# Patient Record
Sex: Female | Born: 1980 | Race: Black or African American | Hispanic: No | Marital: Single | Smoking: Current every day smoker
Health system: Southern US, Community
[De-identification: ages and names within clinical notes are randomized; demographics above are authoritative.]

## PROBLEM LIST (undated history)

## (undated) DIAGNOSIS — S0500XA Injury of conjunctiva and corneal abrasion without foreign body, unspecified eye, initial encounter: Secondary | ICD-10-CM

---

## 2012-07-11 ENCOUNTER — Encounter (HOSPITAL_COMMUNITY): Payer: Self-pay | Admitting: Emergency Medicine

## 2012-07-11 ENCOUNTER — Emergency Department (HOSPITAL_COMMUNITY)
Admission: EM | Admit: 2012-07-11 | Discharge: 2012-07-11 | Disposition: A | Discharge status: Home / Self Care | Payer: Self-pay | Attending: Emergency Medicine | Admitting: Emergency Medicine

## 2012-07-11 ENCOUNTER — Emergency Department (HOSPITAL_COMMUNITY): Payer: Self-pay

## 2012-07-11 DIAGNOSIS — R0609 Other forms of dyspnea: Secondary | ICD-10-CM | POA: Insufficient documentation

## 2012-07-11 DIAGNOSIS — R0989 Other specified symptoms and signs involving the circulatory and respiratory systems: Secondary | ICD-10-CM | POA: Insufficient documentation

## 2012-07-11 DIAGNOSIS — Z87891 Personal history of nicotine dependence: Secondary | ICD-10-CM

## 2012-07-11 DIAGNOSIS — R059 Cough, unspecified: Secondary | ICD-10-CM | POA: Insufficient documentation

## 2012-07-11 DIAGNOSIS — S058X9A Other injuries of unspecified eye and orbit, initial encounter: Secondary | ICD-10-CM | POA: Insufficient documentation

## 2012-07-11 DIAGNOSIS — X58XXXA Exposure to other specified factors, initial encounter: Secondary | ICD-10-CM | POA: Insufficient documentation

## 2012-07-11 DIAGNOSIS — F17200 Nicotine dependence, unspecified, uncomplicated: Secondary | ICD-10-CM | POA: Insufficient documentation

## 2012-07-11 DIAGNOSIS — Y929 Unspecified place or not applicable: Secondary | ICD-10-CM | POA: Insufficient documentation

## 2012-07-11 DIAGNOSIS — S0500XA Injury of conjunctiva and corneal abrasion without foreign body, unspecified eye, initial encounter: Secondary | ICD-10-CM

## 2012-07-11 DIAGNOSIS — R05 Cough: Secondary | ICD-10-CM | POA: Insufficient documentation

## 2012-07-11 DIAGNOSIS — R0600 Dyspnea, unspecified: Secondary | ICD-10-CM

## 2012-07-11 DIAGNOSIS — Y939 Activity, unspecified: Secondary | ICD-10-CM | POA: Insufficient documentation

## 2012-07-11 MED ORDER — FLUORESCEIN SODIUM 1 MG OP STRP
1.0000 | ORAL_STRIP | Freq: Once | OPHTHALMIC | Status: DC
  Filled 2012-07-11: qty 1

## 2012-07-11 MED ORDER — GUAIFENESIN ER 600 MG PO TB12: 1200.0000 mg | ORAL_TABLET | Freq: Two times a day (BID) | ORAL | Status: DC

## 2012-07-11 MED ORDER — ALBUTEROL SULFATE HFA 108 (90 BASE) MCG/ACT IN AERS
2.0000 | INHALATION_SPRAY | Freq: Once | RESPIRATORY_TRACT | Status: AC
  Administered 2012-07-11: 2 via RESPIRATORY_TRACT
  Filled 2012-07-11: qty 6.7

## 2012-07-11 MED ORDER — TETRACAINE HCL 0.5 % OP SOLN
2.0000 [drp] | Freq: Once | OPHTHALMIC | Status: AC
  Administered 2012-07-11: 2 [drp] via OPHTHALMIC
  Filled 2012-07-11: qty 2

## 2012-07-11 MED ORDER — ERYTHROMYCIN 5 MG/GM OP OINT: TOPICAL_OINTMENT | OPHTHALMIC | Status: DC

## 2012-07-11 NOTE — ED Notes (Signed)
PA at bedside.

## 2012-07-11 NOTE — ED Notes (Signed)
Pt requests inhaler for sob at night when sleeping. Left eye red, draining, sore x3 days. A&Ox4, ambulatory, nad.

## 2012-07-11 NOTE — ED Notes (Signed)
Onset 3-4 days ago clear watery drainage with light sensitivity.  Pain currently 9/10 achy and blurry vision. Right eye no complaints.

## 2012-07-11 NOTE — ED Provider Notes (Signed)
History   This chart was scribed for non-physician practitioner working with [name], by Gerlean Ren, ED Scribe. This patient was seen in room TR08C/TR08C and the patient's care was started at 4:05 PM.    CSN: [id number]  Arrival date & time 07/11/12  1331   First MD Initiated Contact with Patient 07/11/12 1553      Chief Complaint  Patient presents with  . Eye Problem     The history is provided by the patient and medical records. No language interpreter was used.  Christine Simpson is a 32 y.o. female who presents to the Emergency Department complaining of 3 days of constant, gradually worsening, aching and burning left eye pain that is worsened by lgihts with associated redness in left eye and blurred vision in left eye.  No itching, no symptoms in right eye.  Pt has applied wet rag at night while sleeping which helps temporarily ease pain.  Pt denies discharge while sleeping and eye is not matted shut upon waking up.  Pt denies any incidents where she may have scratched the eye or splashed any fluids into it.  Pt does not wear glasses or contacts.  Pt denies rhinorrhea, sore throat, otalgia, fever.  Numbing medicine drops given by nurse immediately before entering room eased pain.  Pt also reports productive cough and some dyspnea when lying down at night.  She denies current symptoms.  Pt is a current everyday smoker but denies alcohol use.    History reviewed. No pertinent past medical history.  History reviewed. No pertinent past surgical history.  No family history on file.  History  Substance Use Topics  . Smoking status: Current Every Day Smoker  . Smokeless tobacco: Not on file  . Alcohol Use: No    No OB history provided.   Review of Systems  Constitutional: Negative for fever, diaphoresis, appetite change, fatigue and unexpected weight change.  HENT: Negative for mouth sores and neck stiffness.   Eyes: Positive for photophobia, pain, redness and visual  disturbance. Negative for discharge and itching.  Respiratory: Positive for cough and shortness of breath. Negative for chest tightness and wheezing.   Cardiovascular: Negative for chest pain.  Gastrointestinal: Negative for nausea, vomiting, abdominal pain, diarrhea and constipation.  Genitourinary: Negative for dysuria, urgency, frequency and hematuria.  Musculoskeletal: Negative for back pain.  Skin: Negative for rash.  Neurological: Negative for syncope, light-headedness and headaches.  Hematological: Does not bruise/bleed easily.  Psychiatric/Behavioral: Negative for sleep disturbance. The patient is not nervous/anxious.   All other systems reviewed and are negative.    Allergies  Review of patient's allergies indicates no known allergies.  Home Medications   Current Outpatient Rx  Name  Route  Sig  Dispense  Refill  . ERYTHROMYCIN 5 MG/GM OP OINT      Place a 1/2 inch ribbon of ointment into the lower eyelid.   1 g   0   . GUAIFENESIN ER 600 MG PO TB12   Oral   Take 2 tablets (1,200 mg total) by mouth 2 (two) times daily.   20 tablet   0     BP 145/92  Pulse 82  Temp 98.1 F (36.7 C)  Resp 18  SpO2 99%  Physical Exam  Nursing note and vitals reviewed. Constitutional: She is oriented to person, place, and time. She appears well-developed and well-nourished. No distress.  HENT:  Head: Normocephalic and atraumatic.  Nose: Nose normal.  Mouth/Throat: Uvula is midline, oropharynx is clear  and moist and mucous membranes are normal. No oropharyngeal exudate, posterior oropharyngeal edema, posterior oropharyngeal erythema or tonsillar abscesses.  Eyes: EOM and lids are normal. Pupils are equal, round, and reactive to light. No foreign bodies found. Right eye exhibits no exudate. Left eye exhibits no exudate. Right conjunctiva is not injected. Right conjunctiva has no hemorrhage. Left conjunctiva is injected. Left conjunctiva has no hemorrhage. No scleral icterus.   Fundoscopic exam:      The right eye shows no AV nicking, no hemorrhage and no papilledema.       The left eye shows no AV nicking, no hemorrhage and no papilledema.  Slit lamp exam:      The right eye shows no corneal abrasion, no corneal flare, no corneal ulcer, no foreign body, no fluorescein uptake and no anterior chamber bulge.       The left eye shows corneal abrasion and fluorescein uptake. The left eye shows no corneal flare, no corneal ulcer, no foreign body and no anterior chamber bulge.       Bilateral Distance:  20/30 ;  R Distance:  20/30 ;  L Distance:  20/30  Neck: Normal range of motion. Neck supple.  Cardiovascular: Normal rate, regular rhythm, normal heart sounds and intact distal pulses.  Exam reveals no gallop and no friction rub.   No murmur heard. Pulmonary/Chest: Effort normal and breath sounds normal. No respiratory distress. She has no decreased breath sounds. She has no wheezes. She has no rhonchi. She has no rales. She exhibits no tenderness.  Abdominal: Soft. Bowel sounds are normal. She exhibits no mass. There is no tenderness. There is no rebound and no guarding.  Musculoskeletal: Normal range of motion. She exhibits no edema.  Lymphadenopathy:    She has no cervical adenopathy.  Neurological: She is alert and oriented to person, place, and time. She exhibits normal muscle tone. Coordination normal.       Speech is clear and goal oriented Moves extremities without ataxia  Skin: Skin is warm and dry. She is not diaphoretic. No erythema.  Psychiatric: She has a normal mood and affect.    ED Course  Procedures (including critical care time) DIAGNOSTIC STUDIES: Oxygen Saturation is 99% on room air, normal by my interpretation.    COORDINATION OF CARE: 4:11 PM- Patient informed of clinical course including chest XR for cough, understands medical decision-making process, and agrees with plan.  Dg Chest 2 View  07/11/2012  *RADIOLOGY REPORT*  Clinical Data:  Cough and shortness of breath.  CHEST - 2 VIEW  Comparison: None.  Findings: The trachea is midline.  Heart size normal.  Lungs are clear.  No pleural fluid.  IMPRESSION: No acute findings.   Original Report Authenticated By: Leanna Battles, M.D.    1. Corneal abrasion   2. Smoking history   3. Cough   4. Dyspnea       MDM  Christine Simpson presents with eye irritation and PE consistent with corneal abrasion.  Pt with corneal abrasion on PE.  Eye irrigated w NS, no evidence of FB.  No change in vision, acuity equal bilaterally.  Pt is not a contact lens wearer.  Exam non-concerning for orbital cellulitis, hyphema, corneal ulcers. Patient will be discharged home with erythromycin.   Patient understands to follow up with ophthalmology, & to return to ER if new symptoms develop including change in vision, purulent drainage, or entrapment.  Pt also with URI symptoms of cough and feeling SOB at night.  Pt  CXR negative for acute infiltrate. Patients symptoms are consistent with URI, likely viral etiology vs smoking.  Discussed smoking cessation. Discussed that antibiotics are not indicated for viral infections. Pt will be discharged with symptomatic treatment.  Verbalizes understanding and is agreeable with plan. Pt is hemodynamically stable & in NAD prior to dc.   1. Medications: albuterol, mucinex, erythromycin, usual home medications 2. Treatment: rest, drink plenty of fluids, take medications as prescribed 3. Follow Up: Please followup with your primary doctor for discussion of your diagnoses and further evaluation after today's visit; if you do not have a primary care doctor use the resource guide provided to find one;  I personally performed the services described in this documentation, which was scribed in my presence. The recorded information has been reviewed and is accurate.      Christine Client Aubreigh Fuerte, PA-C 07/11/12 1821

## 2012-07-12 ENCOUNTER — Telehealth (HOSPITAL_COMMUNITY): Payer: Self-pay | Admitting: Emergency Medicine

## 2012-07-12 NOTE — ED Notes (Addendum)
Pharmacy called for direction clarification for Erythromycin Opthalmic ointment instructions read "Place a 1/2 inch ribbon of ointment into the lower eyelid" Dr Hyacinth Meeker consulted and verbal order given for instructions "Place a 1/2 inch ribbon of ointment into the lower eyelid every 4 hours x 7 days"  Clarification given to Pharmacist.

## 2012-07-12 NOTE — ED Provider Notes (Signed)
Medical screening examination/treatment/procedure(s) were performed by non-physician practitioner and as supervising physician I was immediately available for consultation/collaboration.   Christopher J. Pollina, MD 07/12/12 1059 

## 2012-08-02 ENCOUNTER — Emergency Department (HOSPITAL_BASED_OUTPATIENT_CLINIC_OR_DEPARTMENT_OTHER)
Admission: EM | Admit: 2012-08-02 | Discharge: 2012-08-02 | Disposition: A | Discharge status: Home / Self Care | Payer: Self-pay | Attending: Emergency Medicine | Admitting: Emergency Medicine

## 2012-08-02 ENCOUNTER — Encounter (HOSPITAL_BASED_OUTPATIENT_CLINIC_OR_DEPARTMENT_OTHER): Payer: Self-pay

## 2012-08-02 DIAGNOSIS — Z87891 Personal history of nicotine dependence: Secondary | ICD-10-CM | POA: Insufficient documentation

## 2012-08-02 DIAGNOSIS — Z3202 Encounter for pregnancy test, result negative: Secondary | ICD-10-CM | POA: Insufficient documentation

## 2012-08-02 DIAGNOSIS — Z87828 Personal history of other (healed) physical injury and trauma: Secondary | ICD-10-CM | POA: Insufficient documentation

## 2012-08-02 DIAGNOSIS — Z79899 Other long term (current) drug therapy: Secondary | ICD-10-CM | POA: Insufficient documentation

## 2012-08-02 DIAGNOSIS — R350 Frequency of micturition: Secondary | ICD-10-CM | POA: Insufficient documentation

## 2012-08-02 DIAGNOSIS — N760 Acute vaginitis: Secondary | ICD-10-CM | POA: Insufficient documentation

## 2012-08-02 DIAGNOSIS — N390 Urinary tract infection, site not specified: Secondary | ICD-10-CM | POA: Insufficient documentation

## 2012-08-02 DIAGNOSIS — B9689 Other specified bacterial agents as the cause of diseases classified elsewhere: Secondary | ICD-10-CM

## 2012-08-02 HISTORY — DX: Injury of conjunctiva and corneal abrasion without foreign body, unspecified eye, initial encounter: S05.00XA

## 2012-08-02 LAB — URINALYSIS, ROUTINE W REFLEX MICROSCOPIC
Nitrite: NEGATIVE
Protein, ur: NEGATIVE mg/dL
Specific Gravity, Urine: 1.023 (ref 1.005–1.030)
Urobilinogen, UA: 1 mg/dL (ref 0.0–1.0)

## 2012-08-02 LAB — PREGNANCY, URINE: Preg Test, Ur: NEGATIVE

## 2012-08-02 LAB — URINE MICROSCOPIC-ADD ON

## 2012-08-02 LAB — WET PREP, GENITAL

## 2012-08-02 MED ORDER — SULFAMETHOXAZOLE-TRIMETHOPRIM 800-160 MG PO TABS: 1.0000 | ORAL_TABLET | Freq: Two times a day (BID) | ORAL | Status: AC

## 2012-08-02 MED ORDER — METRONIDAZOLE 500 MG PO TABS: 500.0000 mg | ORAL_TABLET | Freq: Two times a day (BID) | ORAL | Status: DC

## 2012-08-02 NOTE — ED Provider Notes (Signed)
Medical screening examination/treatment/procedure(s) were performed by non-physician practitioner and as supervising physician I was immediately available for consultation/collaboration.   Gwyneth Sprout, MD 08/02/12 [phone]

## 2012-08-02 NOTE — ED Notes (Addendum)
Pt reports urinary frequency x 4 days and abnormal vaginal bleeding x 4-5 months. Pt does not has a GYN. She also reports continued pain in left eye. She was seen at [facility] 07/11/12 and diagnosed with a corneal abrasion.

## 2012-08-02 NOTE — ED Provider Notes (Signed)
History     CSN: [id number]  Arrival date & time 08/02/12  1139   First MD Initiated Contact with Patient 08/02/12 1213      Chief Complaint  Patient presents with  . Urinary Frequency  . Vaginal Bleeding    (Consider location/radiation/quality/duration/timing/severity/associated sxs/prior treatment) HPI Comments: Pt states that she has had irregular vaginal bleeding over the last couple of months where her period is not predictable:pt states that she has also had urinary frequency for the last couple of days:no fever,vomiting, abdominal pain  Patient is a 32 y.o. female presenting with frequency and vaginal bleeding. The history is provided by the patient. No language interpreter was used.  Urinary Frequency This is a new problem. The current episode started in the past 7 days. The problem occurs constantly. The problem has been unchanged. Pertinent negatives include no abdominal pain or fever.  Vaginal Bleeding Pertinent negatives include no abdominal pain or fever.    Past Medical History  Diagnosis Date  . Corneal abrasion     History reviewed. No pertinent past surgical history.  No family history on file.  History  Substance Use Topics  . Smoking status: Former Games developer  . Smokeless tobacco: Not on file  . Alcohol Use: No    OB History   Grav Para Term Preterm Abortions TAB SAB Ect Mult Living                  Review of Systems  Constitutional: Negative for fever.  Gastrointestinal: Negative for abdominal pain.  Genitourinary: Positive for frequency and vaginal bleeding.    Allergies  Review of patient's allergies indicates no known allergies.  Home Medications   Current Outpatient Rx  Name  Route  Sig  Dispense  Refill  . erythromycin ophthalmic ointment      Place a 1/2 inch ribbon of ointment into the lower eyelid.   1 g   0   . guaiFENesin (MUCINEX) 600 MG 12 hr tablet   Oral   Take 2 tablets (1,200 mg total) by mouth 2 (two) times daily.  20 tablet   0     BP 144/93  Pulse 86  Temp(Src) 98.1 F (36.7 C) (Oral)  Resp 20  Ht 5\' 6"  (1.676 m)  Wt 315 lb (142.883 kg)  BMI 50.87 kg/m2  SpO2 97%  LMP 07/27/2012  Physical Exam  Nursing note and vitals reviewed. Constitutional: She is oriented to person, place, and time. She appears well-developed and well-nourished.  HENT:  Head: Normocephalic and atraumatic.  Cardiovascular: Normal rate and regular rhythm.   Pulmonary/Chest: Effort normal and breath sounds normal.  Abdominal: Soft. Bowel sounds are normal.  Genitourinary:  Mild red discharge noted in vaginal vault:-cmt  Musculoskeletal: Normal range of motion.  Neurological: She is alert and oriented to person, place, and time.  Skin: Skin is warm and dry.  Psychiatric: She has a normal mood and affect.    ED Course  Procedures (including critical care time)  Labs Reviewed  WET PREP, GENITAL - Abnormal; Notable for the following:    Clue Cells Wet Prep HPF POC FEW (*)    WBC, Wet Prep HPF POC MANY (*)    All other components within normal limits  URINALYSIS, ROUTINE W REFLEX MICROSCOPIC - Abnormal; Notable for the following:    Hgb urine dipstick LARGE (*)    Leukocytes, UA SMALL (*)    All other components within normal limits  URINE MICROSCOPIC-ADD ON - Abnormal; Notable for the  following:    Bacteria, UA FEW (*)    All other components within normal limits  GC/CHLAMYDIA PROBE AMP  URINE CULTURE  PREGNANCY, URINE   No results found.   1. UTI (lower urinary tract infection)   2. BV (bacterial vaginosis)       MDM  Pt okay to follow up with health department:abdomen not acute       Teressa Lower, NP 08/02/12 1344

## 2012-08-03 LAB — URINE CULTURE: Culture: NO GROWTH

## 2012-08-03 LAB — GC/CHLAMYDIA PROBE AMP
CT Probe RNA: NEGATIVE
GC Probe RNA: NEGATIVE

## 2014-08-25 ENCOUNTER — Emergency Department (HOSPITAL_BASED_OUTPATIENT_CLINIC_OR_DEPARTMENT_OTHER)
Admission: EM | Admit: 2014-08-25 | Discharge: 2014-08-25 | Disposition: A | Discharge status: Home / Self Care | Payer: Self-pay | Attending: Emergency Medicine | Admitting: Emergency Medicine

## 2014-08-25 ENCOUNTER — Encounter (HOSPITAL_BASED_OUTPATIENT_CLINIC_OR_DEPARTMENT_OTHER): Payer: Self-pay

## 2014-08-25 DIAGNOSIS — K088 Other specified disorders of teeth and supporting structures: Secondary | ICD-10-CM | POA: Insufficient documentation

## 2014-08-25 DIAGNOSIS — K0889 Other specified disorders of teeth and supporting structures: Secondary | ICD-10-CM

## 2014-08-25 DIAGNOSIS — K0381 Cracked tooth: Secondary | ICD-10-CM | POA: Insufficient documentation

## 2014-08-25 DIAGNOSIS — Z87891 Personal history of nicotine dependence: Secondary | ICD-10-CM | POA: Insufficient documentation

## 2014-08-25 MED ORDER — IBUPROFEN 400 MG PO TABS
600.0000 mg | ORAL_TABLET | Freq: Once | ORAL | Status: AC
  Administered 2014-08-25: 600 mg via ORAL
  Filled 2014-08-25 (×2): qty 1

## 2014-08-25 NOTE — ED Notes (Signed)
Left lower toothache x 1 week.

## 2014-08-25 NOTE — Discharge Instructions (Signed)
Dental Pain °A tooth ache may be caused by cavities (tooth decay). Cavities expose the nerve of the tooth to air and hot or cold temperatures. It may come from an infection or abscess (also called a boil or furuncle) around your tooth. It is also often caused by dental caries (tooth decay). This causes the pain you are having. °DIAGNOSIS  °Your caregiver can diagnose this problem by exam. °TREATMENT  °· If caused by an infection, it may be treated with medications which kill germs (antibiotics) and pain medications as prescribed by your caregiver. Take medications as directed. °· Only take over-the-counter or prescription medicines for pain, discomfort, or fever as directed by your caregiver. °· Whether the tooth ache today is caused by infection or dental disease, you should see your dentist as soon as possible for further care. °SEEK MEDICAL CARE IF: °The exam and treatment you received today has been provided on an emergency basis only. This is not a substitute for complete medical or dental care. If your problem worsens or new problems (symptoms) appear, and you are unable to meet with your dentist, call or return to this location. °SEEK IMMEDIATE MEDICAL CARE IF:  °· You have a fever. °· You develop redness and swelling of your face, jaw, or neck. °· You are unable to open your mouth. °· You have severe pain uncontrolled by pain medicine. °MAKE SURE YOU:  °· Understand these instructions. °· Will watch your condition. °· Will get help right away if you are not doing well or get worse. °Document Released: 05/23/2005 Document Revised: 08/15/2011 Document Reviewed: 01/09/2008 °ExitCare® Patient Information ©2015 [employer]. This information is not intended to replace advice given to you by your health care provider. Make sure you discuss any questions you have with your health care provider. ° °Dental Care and Dentist Visits °Dental care supports good overall health. Regular dental visits can also help you  avoid dental pain, bleeding, infection, and other more serious health problems in the future. It is important to keep the mouth healthy because diseases in the teeth, gums, and other oral tissues can spread to other areas of the body. Some problems, such as diabetes, heart disease, and pre-term labor have been associated with poor oral health.  °See your dentist every 6 months. If you experience emergency problems such as a toothache or broken tooth, go to the dentist right away. If you see your dentist regularly, you may catch problems early. It is easier to be treated for problems in the early stages.  °WHAT TO EXPECT AT A DENTIST VISIT  °Your dentist will look for many common oral health problems and recommend proper treatment. At your regular dental visit, you can expect: °· Gentle cleaning of the teeth and gums. This includes scraping and polishing. This helps to remove the sticky substance around the teeth and gums (plaque). Plaque forms in the mouth shortly after eating. Over time, plaque hardens on the teeth as tartar. If tartar is not removed regularly, it can cause problems. Cleaning also helps remove stains. °· Periodic X-rays. These pictures of the teeth and supporting bone will help your dentist assess the health of your teeth. °· Periodic fluoride treatments. Fluoride is a natural mineral shown to help strengthen teeth. Fluoride treatment involves applying a fluoride gel or varnish to the teeth. It is most commonly done in children. °· Examination of the mouth, tongue, jaws, teeth, and gums to look for any oral health problems, such as: °¨ Cavities (dental caries). This is   decay on the tooth caused by plaque, sugar, and acid in the mouth. It is best to catch a cavity when it is small.  Inflammation of the gums caused by plaque buildup (gingivitis).  Problems with the mouth or malformed or misaligned teeth.  Oral cancer or other diseases of the soft tissues or jaws. KEEP YOUR TEETH AND GUMS  HEALTHY For healthy teeth and gums, follow these general guidelines as well as your dentist's specific advice:  Have your teeth professionally cleaned at the dentist every 6 months.  Brush twice daily with a fluoride toothpaste.  Floss your teeth daily.  Ask your dentist if you need fluoride supplements, treatments, or fluoride toothpaste.  Eat a healthy diet. Reduce foods and drinks with added sugar.  Avoid smoking. TREATMENT FOR ORAL HEALTH PROBLEMS If you have oral health problems, treatment varies depending on the conditions present in your teeth and gums.  Your caregiver will most likely recommend good oral hygiene at each visit.  For cavities, gingivitis, or other oral health disease, your caregiver will perform a procedure to treat the problem. This is typically done at a separate appointment. Sometimes your caregiver will refer you to another dental specialist for specific tooth problems or for surgery. SEEK IMMEDIATE DENTAL CARE IF:  You have pain, bleeding, or soreness in the gum, tooth, jaw, or mouth area.  A permanent tooth becomes loose or separated from the gum socket.  You experience a blow or injury to the mouth or jaw area. Document Released: 02/02/2011 Document Revised: 08/15/2011 Document Reviewed: 02/02/2011 [facility] Patient Information 2015 [place], [place]. This information is not intended to replace advice given to you by your health care provider. Make sure you discuss any questions you have with your health care provider.   Emergency Department Resource Guide 1) Find a Doctor and Pay Out of Pocket Although you won't have to find out who is covered by your insurance plan, it is a good idea to ask around and get recommendations. You will then need to call the office and see if the doctor you have chosen will accept you as a new patient and what types of options they offer for patients who are self-pay. Some doctors offer discounts or will set up payment plans  for their patients who do not have insurance, but you will need to ask so you aren't surprised when you get to your appointment.  2) Contact Your Local Health Department Not all health departments have doctors that can see patients for sick visits, but many do, so it is worth a call to see if yours does. If you don't know where your local health department is, you can check in your phone book. The CDC also has a tool to help you locate your state's health department, and many state websites also have listings of all of their local health departments.  3) Find a [facility] If your illness is not likely to be very severe or complicated, you may want to try a walk in clinic. These are popping up all over the country in pharmacies, drugstores, and shopping centers. They're usually staffed by nurse practitioners or physician assistants that have been trained to treat common illnesses and complaints. They're usually fairly quick and inexpensive. However, if you have serious medical issues or chronic medical problems, these are probably not your best option.  No Primary Care Doctor: - Call Health Connect at  [phone] - they can help you locate a primary care doctor that  accepts your  insurance, provides certain services, etc. - Physician Referral Service- [phone]  Chronic Pain Problems: Organization         Address  Phone   Notes  [facility]  [phone] Patients need to be referred by their primary care doctor.   Medication Assistance: Organization         Address  Phone   Notes  [facility] Medication [facility] [address] [place], [place] [address] [phone] --Must be a resident of [facility] -- Must have NO insurance coverage whatsoever (no Medicaid/ Medicare, etc.) -- The pt. MUST have a primary care doctor that directs their care regularly and follows them in the community   [facility]  [phone]   [employer]  [phone]    Agencies that provide inexpensive medical care: Organization         Address  Phone   Notes  [place]  [phone]   [facility]    [phone]   [facility] [place], [place] [address] [phone]   [place] [address], [place] [phone]   Planned Parenthood    [phone]   [facility]    [phone]   [place] [address], [place] Phone:  [phone], Fax:  [phone] Hours of Operation:  9 am - 6 pm, M-F.  Also accepts Medicaid/Medicare and self-pay.  [facility] for [place], [address], [place] Phone: [phone], Fax: [phone]. Hours of Operation:  8:30 am - 5:30 pm, M-F.  Also accepts Medicaid and self-pay.  [facility] [place] [address], [place] Phone: [phone]   [place], [place], [place] [phone], Ext. [phone] Mondays & Thursdays: 7-9 AM.  First 15 patients are seen on a first come, first serve basis.    [place] Providers:  Organization         Address  Phone   Notes  [facility] [address], [place] [phone] Also accepts self-pay patients.  [facility] [address], [place]  [phone]   [place], [address], [place] [phone]   [facility] [address], [place] [phone]   Lucianne Lei [address], [place]   [phone] Only accepts [place] Access [place] patients after they have their name applied to their card.   Self-Pay (no insurance) in [facility]:  Organization         Address  Phone   Notes  Sickle Cell Patients, [facility] [place] [phone]   [facility] [place] [phone]   [facility] [place]  [place], [place], [place] [phone]   [facility]/Dr. Osei-Bonsu  [address], [place] or [address], [place] [phone] Phone number for both [place] and [place] locations is the same.  [facility] [address] [facility] [phone]   [facility] [address] [place] or [address] [phone]   [facility] [phone]  [place] [phone], phone; [phone], fax Sees patients 1st and 3rd Saturday of every month.  Must not qualify for public or private insurance (i.e. Medicaid, Medicare, [place] Health Choice, Veterans' Benefits)  Household income should be no more than 200% of the poverty level The clinic cannot treat you if you are pregnant or think you are pregnant  Sexually transmitted diseases are not treated at the clinic.    Dental Care: Organization         Address  Phone  Notes  [employer] [facility] Department of [facility] [place] [phone] Accepts children up to age 20 who are enrolled in [place] or [place]; pregnant women with a Medicaid card; and children who have applied for Medicaid or [place] Health Choice, but were declined, whose parents can pay a reduced fee at time of service.  [facility] Department of [facility]  [address], [place] [phone] Accepts children up to age 35 who are enrolled in [place] or [place]; pregnant women with a Medicaid card; and children who have applied for Medicaid or  Health Choice, but were declined, whose parents can pay a reduced fee at time of service.  [place] Adult [facility] Access PRO[facility]  [place] [phone] Patients are  seen by appointment only. Walk-ins are not accepted. [place] will see patients 68 years of age and older. Monday - Tuesday (8am-5pm) Most Wednesdays (8:30-5pm) $30 per visit, cash only  [facility] Adult Dental Access PROGRAM  [address], [facility] [phone] Patients are seen by appointment only. Walk-ins are not accepted. [place] will see patients 73 years of age and older. One Wednesday Evening (Monthly: Volunteer Based).  $30 per visit, cash only  [place]  [phone] for adults; Children under age 63, call Graduate Pediatric Dentistry at [phone]. Children aged 85-14, please call [phone] to request a pediatric application.  Dental services are provided in all areas of dental care including fillings, crowns and bridges, complete and partial dentures, implants, gum treatment, root canals, and extractions. Preventive care is also provided. Treatment is provided to both adults and children. Patients are selected via a lottery and there is often a waiting list.   [facility] [address], [place]  [phone] [email or web address]   [facility] [address] [place], [place] [phone], Ext. [phone] Second and Fourth Thursday of each month, opens at 6:30 AM; Clinic ends at 9 AM.  Patients are seen on a first-come first-served basis, and a limited number are seen during each clinic.   [facility]  [address] [place], [place] [phone]   Eligibility Requirements You must have lived in [place], [place], or [place] counties for at least the last three months.   You cannot be eligible for state or federal sponsored [employer], including [employer], [place], or [employer].   You generally cannot be eligible for healthcare insurance through your employer.    How to apply: Eligibility screenings are held every Tuesday and Wednesday afternoon from 1:00 pm until 4:00  pm. You do not need an appointment for the interview!  [facility] [address], [place]roz, [place]   [place]  [place]  [phone]

## 2014-08-25 NOTE — ED Provider Notes (Signed)
CSN: [id number]     Arrival date & time 08/25/14  1417 History  This chart was scribed for Christine MoMatthew Aaron Boeh, MD by Abel PrestoKara Demonbreun, ED Scribe. This patient was seen in room MH04/MH04 and the patient's care was started at 3:14 PM.    Chief Complaint  Patient presents with  . Dental Pain     Patient is a 34 y.o. female presenting with tooth pain. The history is provided by the patient. No language interpreter was used.  Dental Pain Location:  Lower Quality:  Throbbing Severity:  Moderate Onset quality:  Sudden Duration:  2 days Timing:  Constant Progression:  Worsening Chronicity:  New Context: filling fell out   Previous work-up:  Filled cavity Relieved by:  None tried Worsened by:  Nothing tried Ineffective treatments:  None tried Associated symptoms: facial pain   Associated symptoms: no congestion, no facial swelling, no fever and no gum swelling   Risk factors: lack of dental care    HPI Comments: Christine Simpson is a 34 y.o. female who presents to the Emergency Department complaining of constant throbbing lower right dental pain with onset 2 days ago, worsening today. Pt states she thinks her filling has fallen out. Pt denies fever, cough, congestion, and sore throat. Pt does not have a dentist.   Past Medical History  Diagnosis Date  . Corneal abrasion    History reviewed. No pertinent past surgical history. No family history on file. History  Substance Use Topics  . Smoking status: Former Games developermoker  . Smokeless tobacco: Not on file  . Alcohol Use: No   OB History    No data available     Review of Systems  Constitutional: Negative for fever.  HENT: Positive for dental problem. Negative for congestion, facial swelling and sore throat.   Respiratory: Negative for cough.   All other systems reviewed and are negative.     Allergies  Review of patient's allergies indicates no known allergies.  Home Medications   Prior to Admission medications   Medication Sig  Start Date End Date Taking? Authorizing Provider  UNKNOWN TO PATIENT abx for vaginal yeast infection   Yes Historical Provider, MD   BP 178/102 mmHg  Pulse 78  Temp(Src) 98.5 F (36.9 C) (Oral)  Resp 18  Ht 5\' 6"  (1.676 m)  Wt 324 lb (146.965 kg)  BMI 52.32 kg/m2  SpO2 97%  LMP 08/17/2014 Physical Exam  Constitutional: She is oriented to person, place, and time. She appears well-developed and well-nourished.  HENT:  Head: Normocephalic and atraumatic.  Right Ear: External ear normal.  Left Ear: External ear normal.  No abscesses, dentition with dental fracture to L 1st molar, chronic appearing  Eyes: Conjunctivae and EOM are normal. Pupils are equal, round, and reactive to light.  Neck: Normal range of motion. Neck supple.  Cardiovascular: Normal rate, regular rhythm, normal heart sounds and intact distal pulses.   Pulmonary/Chest: Effort normal and breath sounds normal.  Abdominal: Soft. Bowel sounds are normal. There is no tenderness.  Musculoskeletal: Normal range of motion.  Neurological: She is alert and oriented to person, place, and time.  Skin: Skin is warm and dry.  Psychiatric: She has a normal mood and affect. Her behavior is normal.  Nursing note and vitals reviewed.   ED Course  Procedures (including critical care time) DIAGNOSTIC STUDIES: Oxygen Saturation is 97% on room air, normal by my interpretation.    COORDINATION OF CARE: 3:19 PM Discussed treatment plan with patient at beside, the  patient agrees with the plan and has no further questions at this time.   Labs Review Labs Reviewed - No data to display  Imaging Review No results found.   EKG Interpretation None      MDM   Final diagnoses:  Pain, dental      34 y.o. female without pertinent PMH presents with recurrent dental pain from a lost filling. Symptoms present 2 days, no trismus, systemic infectious symptoms. Exam reassuring. Patient offered dental block and refused. Unable to give  narcotics due to the fact the patient drove. Discharged home with dentistry follow-up..    I have reviewed all laboratory and imaging studies if ordered as above  1. Pain, dental          Christine Mo, MD 08/25/14 [phone]

## 2015-06-22 ENCOUNTER — Emergency Department (HOSPITAL_BASED_OUTPATIENT_CLINIC_OR_DEPARTMENT_OTHER)
Admission: EM | Admit: 2015-06-22 | Discharge: 2015-06-22 | Disposition: A | Discharge status: Home / Self Care | Payer: Self-pay | Attending: Emergency Medicine | Admitting: Emergency Medicine

## 2015-06-22 ENCOUNTER — Encounter (HOSPITAL_BASED_OUTPATIENT_CLINIC_OR_DEPARTMENT_OTHER): Payer: Self-pay | Admitting: *Deleted

## 2015-06-22 DIAGNOSIS — H53143 Visual discomfort, bilateral: Secondary | ICD-10-CM | POA: Insufficient documentation

## 2015-06-22 DIAGNOSIS — H6123 Impacted cerumen, bilateral: Secondary | ICD-10-CM | POA: Insufficient documentation

## 2015-06-22 DIAGNOSIS — H5713 Ocular pain, bilateral: Secondary | ICD-10-CM | POA: Insufficient documentation

## 2015-06-22 DIAGNOSIS — Z87828 Personal history of other (healed) physical injury and trauma: Secondary | ICD-10-CM | POA: Insufficient documentation

## 2015-06-22 DIAGNOSIS — Z87891 Personal history of nicotine dependence: Secondary | ICD-10-CM | POA: Insufficient documentation

## 2015-06-22 DIAGNOSIS — H9201 Otalgia, right ear: Secondary | ICD-10-CM

## 2015-06-22 DIAGNOSIS — H538 Other visual disturbances: Secondary | ICD-10-CM | POA: Insufficient documentation

## 2015-06-22 MED ORDER — NAPHAZOLINE-PHENIRAMINE 0.025-0.3 % OP SOLN
1.0000 [drp] | Freq: Four times a day (QID) | OPHTHALMIC | Status: DC | PRN
Start: 2015-06-22 — End: 2016-08-10

## 2015-06-22 MED ORDER — FLUORESCEIN SODIUM 1 MG OP STRP
1.0000 | ORAL_STRIP | Freq: Once | OPHTHALMIC | Status: AC
  Administered 2015-06-22: 1 via OPHTHALMIC
  Filled 2015-06-22: qty 1

## 2015-06-22 MED ORDER — TETRACAINE HCL 0.5 % OP SOLN
1.0000 [drp] | Freq: Once | OPHTHALMIC | Status: AC
  Administered 2015-06-22: 1 [drp] via OPHTHALMIC
  Filled 2015-06-22: qty 4

## 2015-06-22 MED FILL — VISINE-A EYE ALLERGY DROPS: 0.025-0.3 | 30 days supply | Qty: 15 | Fill #0

## 2015-06-22 NOTE — ED Notes (Signed)
Right ear pain. States she has been light sensitive for a week.

## 2015-06-22 NOTE — ED Provider Notes (Signed)
CSN: [id number]     Arrival date & time 06/22/15  1231 History   First MD Initiated Contact with Patient 06/22/15 1322     Chief Complaint  Patient presents with  . Eye Pain  . Otalgia     (Consider location/radiation/quality/duration/timing/severity/associated sxs/prior Treatment) HPI   Patient is a 35 year old female with no past medical history presents the ED with complaint of bilateral eye pain. She notes she has had bilateral blurred vision that occurred 2 weeks ago with associated redness, watering of her eyes, light sensitivity and mild itching. She notes the symptoms resolved last week after using over-the-counter normal saline eyedrops but states that her symptoms returned approximately 3 days ago and has since been constant. Denies any recent trauma/injury to her eyes. Denies contact use. Patient also reports having right ear pain and notes her hearing in her right ear is muffled. She notes she used peroxide at home to try to remove the earwax without relief. Denies drainage. Denies fever, headache, lightheadedness, dizziness, is a congestion, rhinorrhea, sore throat.   Past Medical History  Diagnosis Date  . Corneal abrasion    History reviewed. No pertinent past surgical history. No family history on file. Social History  Substance Use Topics  . Smoking status: Former Games developer  . Smokeless tobacco: None  . Alcohol Use: No   OB History    No data available     Review of Systems  HENT: Positive for ear pain.   Eyes: Positive for photophobia, discharge (watery), redness, itching and visual disturbance (blurred vision).  All other systems reviewed and are negative.     Allergies  Review of patient's allergies indicates no known allergies.  Home Medications   Prior to Admission medications   Medication Sig Start Date End Date Taking? Authorizing Provider  naphazoline-pheniramine (NAPHCON-A) 0.025-0.3 % ophthalmic solution Place 1 drop into both eyes every 6 (six)  hours as needed for irritation. 06/22/15   Barrett Henle, PA-C  UNKNOWN TO PATIENT abx for vaginal yeast infection    Historical Provider, MD   BP 113/68 mmHg  Pulse 70  Temp(Src) 98.7 F (37.1 C) (Oral)  Resp 18  Ht 5\' 6"  (1.676 m)  Wt 146.965 kg  BMI 52.32 kg/m2  SpO2 98%  LMP 06/15/2015 Physical Exam  Constitutional: She is oriented to person, place, and time. She appears well-developed and well-nourished.  HENT:  Head: Normocephalic and atraumatic.  Nose: Nose normal. Right sinus exhibits no maxillary sinus tenderness and no frontal sinus tenderness. Left sinus exhibits no maxillary sinus tenderness and no frontal sinus tenderness.  Mouth/Throat: Uvula is midline, oropharynx is clear and moist and mucous membranes are normal.  Bilateral cerumen impaction, unable to visualize TMs.  Eyes: EOM and lids are normal. Pupils are equal, round, and reactive to light. Lids are everted and swept, no foreign bodies found. Right eye exhibits no chemosis, no discharge and no exudate. No foreign body present in the right eye. Left eye exhibits no chemosis, no discharge and no exudate. No foreign body present in the left eye. Right conjunctiva is injected. Left conjunctiva is injected. No scleral icterus.  Slit lamp exam:      The right eye shows no corneal abrasion, no corneal flare, no corneal ulcer, no foreign body and no fluorescein uptake.       The left eye shows no corneal abrasion, no corneal flare, no corneal ulcer, no foreign body and no fluorescein uptake.  Neck: Normal range of motion. Neck supple.  Cardiovascular: Normal rate, regular rhythm, normal heart sounds and intact distal pulses.   Pulmonary/Chest: Effort normal and breath sounds normal. No respiratory distress. She has no wheezes. She has no rales. She exhibits no tenderness.  Abdominal: Soft. Bowel sounds are normal. She exhibits no distension and no mass. There is no tenderness. There is no rebound and no guarding.   Musculoskeletal: She exhibits no edema.  Lymphadenopathy:    She has no cervical adenopathy.  Neurological: She is alert and oriented to person, place, and time.  Skin: Skin is warm and dry.  Nursing note and vitals reviewed.   ED Course  Procedures (including critical care time) Labs Review Labs Reviewed - No data to display  Imaging Review No results found. I have personally reviewed and evaluated these images and lab results as part of my medical decision-making.  Filed Vitals:   06/22/15 1506 06/22/15 1539  BP: 132/90 113/68  Pulse: 66 70  Temp:  98.7 F (37.1 C)  Resp: 18      MDM   Final diagnoses:  Eye pain, bilateral  Ear pain, right    Patient presents with bilateral eye pain and right ear pain. Denies contact use. VSS. On initial exam bilateral cerumen impaction present, unable to visualize TMs. Eye exam is revealed bilateral injection of conjunctiva, with watery drainage noted. Visual acuity unremarkable. No fluorescein uptake to bilateral eyes on exam. I do not suspect corneal abrasion/ulcer at this time. Nurse irrigated bilateral ears for ear wax removal. On reexamination, TMs clear. I suspect pt's sxs are likely due to allergic conjunctivitis. Plan to d/c pt home with naphazoline eye drops. Advised pt to follow up with PCP.  Evaluation does not show pathology requring ongoing emergent intervention or admission. Pt is hemodynamically stable and mentating appropriately. Discussed findings/results and plan with patient/guardian, who agrees with plan. All questions answered. Return precautions discussed and outpatient follow up given.      Satira Sarkicole Elizabeth EastonNadeau, [place] JerseyPA-C 06/22/15 1716  Gwyneth SproutWhitney Plunkett, MD 06/26/15 2050

## 2015-06-22 NOTE — Discharge Instructions (Signed)
User eyedrops as prescribed. I also recommend continuing to use over the counter artifical tears. Please follow up with a primary care provider from the Resource Guide provided below in 4-5 days. Please return to the Emergency Department if symptoms worsen or new onset of visual changes, discharge, headache, change in hearing, ear drainage, eye discharge.   Emergency Department Resource Guide 1) Find a Doctor and Pay Out of Pocket Although you won't have to find out who is covered by your insurance plan, it is a good idea to ask around and get recommendations. You will then need to call the office and see if the doctor you have chosen will accept you as a new patient and what types of options they offer for patients who are self-pay. Some doctors offer discounts or will set up payment plans for their patients who do not have insurance, but you will need to ask so you aren't surprised when you get to your appointment.  2) Contact Your Local Health Department Not all health departments have doctors that can see patients for sick visits, but many do, so it is worth a call to see if yours does. If you don't know where your local health department is, you can check in your phone book. The CDC also has a tool to help you locate your state's health department, and many state websites also have listings of all of their local health departments.  3) Find a Walk-in Clinic If your illness is not likely to be very severe or complicated, you may want to try a walk in clinic. These are popping up all over the country in pharmacies, drugstores, and shopping centers. They're usually staffed by nurse practitioners or physician assistants that have been trained to treat common illnesses and complaints. They're usually fairly quick and inexpensive. However, if you have serious medical issues or chronic medical problems, these are probably not your best option.  No Primary Care Doctor: - Call Health Connect at  [phone]  - they can help you locate a primary care doctor that  accepts your insurance, provides certain services, etc. - Physician Referral Service- [phone]  Chronic Pain Problems: Organization         Address  Phone   Notes  [facility]  [phone]) [phone] Patients need to be referred by their primary care doctor.   Medication Assistance: Organization         Address  Phone   Notes  [facility] [address] [place], [place] [address] [phone]) [phone] --Must be a resident of [facility] -- Must have NO insurance coverage whatsoever (no Medicaid/ Medicare, etc.) -- The pt. MUST have a primary care doctor that directs their care regularly and follows them in the community   MedAssist  [phone]   [employer]  [phone]) [phone]    Agencies that provide inexpensive medical care: Organization         Address  Phone   Notes  [facility]  [phone](336) [phone]   [facility]    [phone]) [phone]   [facility] [address] [place], [place] [address] [phone]) [phone]   [facility] [address], [place] [phone]   Planned Parenthood    [phone]   [facility]    [phone]   [facility]  [address], [employer] [place] Phone:  [phone](336) [phone], Fax:  [phone])  [phone] Hours of Operation:  9 am - 6 pm, M-F.  Also accepts Medicaid/Medicare and self-pay.  [facility] for [place], [address], [place] Phone: [phone], Fax: [phone]. Hours of Operation:  8:30 am - 5:30 pm, M-F.  Also accepts Medicaid and self-pay.  [facility] [address], [place] Phone: [phone]   [place], [place], [place] [phone], Ext. [phone] Mondays & Thursdays: 7-9 AM.  First 15 patients are seen on a first come, first serve basis.     [place] Providers:  Organization         Address  Phone   Notes  [facility] [address], [place] [phone] Also accepts self-pay patients.  [facility] [address] [place], [place]  [phone]   [place], [address], [place] [phone]   [facility] [address] [place] [phone]   Lucianne Lei [address], [place]   [phone] Only accepts [place] Access [place] patients after they have their name applied to their card.   Self-Pay (no insurance) in [facility]:  Organization         Address  Phone   Notes  Sickle Cell Patients, [facility] [place] [phone]   [facility]gent [facility] [place] [phone]   [facility] [place]  [place], [place], [place] [phone]   [facility]/Dr. Osei-Bonsu  [address], [place] or [address], [place] [phone] Phone number for both [place] and [place] locations is the same.  Urgent Medical and [facility] [address] [place] [phone]   [facility] [address], [place] or [address] [phone]   [facility] [address] [place] [phone], phone; [phone], fax Sees patients 1st and 3rd Saturday of every month.  Must not qualify for public or private insurance (i.e. Medicaid, Medicare, [place] Health Choice, Veterans' Benefits)  Household income should be no more than 200% of the poverty level The clinic cannot treat you if you are pregnant or think you are pregnant  Sexually transmitted diseases are not treated at the clinic.    Dental Care: Organization         Address  Phone  Notes  [facility]  Department of [facility] [place] [phone] Accepts children up to age 56 who are enrolled in [place] or [place]; pregnant women with a Medicaid card; and children who have applied for Medicaid or [place] Health Choice, but were declined, whose parents can pay a reduced fee at time of service.  [facility] Department of [facility]  [address], [place] [phone] Accepts children up to age 63 who are enrolled in [place] or [place]; pregnant women with a Medicaid card; and children who have applied for Medicaid or [place] Health Choice, but were declined, whose parents can pay a reduced fee at time of service.  [facility] Access PROGRAM  [place] [phone] Patients are seen by appointment only. Walk-ins are not accepted. [place]  will see patients 67 years of age and older. Monday - Tuesday (8am-5pm) Most Wednesdays (8:30-5pm) $30 per visit, cash only  [facility] Adult Dental Access PROGRAM  [address], [facility] [phone] Patients are seen by appointment only. Walk-ins are not accepted. [place] will see patients 32 years of age and older. One Wednesday Evening (Monthly: Volunteer Based).  $30 per visit, cash only  [place]  [phone] for adults; Children under age 60, call Graduate Pediatric Dentistry at [phone]. Children aged 74-14, please call [phone] to request a pediatric application.  Dental services are provided in all areas of dental care including fillings, crowns and bridges, complete and partial dentures, implants, gum treatment, root canals, and extractions. Preventive care is also provided. Treatment is provided to both adults and children. Patients are selected via a lottery and there is often a waiting list.   [facility] [address], [place]  [phone]  [email or web address]   [facility] [address] [place], [place] [phone], Ext. [phone] Second and Fourth Thursday of each month, opens at 6:30 AM; Clinic ends at 9 AM.  Patients are seen on a first-come first-served basis, and a limited number are seen during each clinic.   [facility]  [address] Hillard Danker [place], [place] [phone]   Eligibility Requirements You must have lived in [place], [place], or [place] counties for at least the last three months.   You cannot be eligible for state or federal sponsored [employer], including [employer], [place], or [employer].   You generally cannot be eligible for healthcare insurance through your employer.    How to apply: Eligibility screenings are held every Tuesday and Wednesday afternoon from 1:00 pm until 4:00 pm. You do not need an appointment for the interview!  [facility] [address] [place], [place]   [place]  [place] Department  [place]  [phone]    Behavioral Health Resources in the Community: Intensive Outpatient Programs Organization         Address  Phone  Notes  [place]. [address], [place], [place] [phone]   [facility] Outpatient [address] [place], [place]   ADS: Alcohol & Drug Svcs [address], [place], [place]   [place] [address],  [place], [place] or [phone]   Substance Abuse Resources Organization         Address  Phone  Notes  Alcohol and Drug Services  [phone]   [place]  [phone]   [place]   Chinita Pester  [phone]   Residential & Outpatient Substance Abuse Program  [phone]   Psychological Services Organization          Address  Phone  Notes  [facility] [place]  Hall  [phone]   [place] [address], [place] or [phone]    Mobile Crisis Teams Organization         Address  Phone  Notes  [facility]rapeutic Alternatives, Mobile Crisis Care Unit  [phone]   Assertive Psychotherapeutic Services  [address] [place], [place]   [facility] [address] [place] [phone]    Self-Help/Support Groups Organization  Address  Phone             Notes  [place]. of [place] - variety of support groups  [place] Call for more information  Narcotics Anonymous (NA), Caring Services [address], [employer] [place]  2 meetings at this location   Special educational needs teacher         Address  Phone  Notes  ASAP Residential Treatment [place],    [place]  [phone]   [facility]  [address] [place] [phone], [place], [place]   [place], [place] [phone] Admissions: 8am-3pm M-F  Incentives Substance [place] [address]    [place], [place] [phone]   [facility] [address] [place], [place], [place]   [facility] [address]  [place], [place]   [facility] - Intensive Outpatient [address], [place], [place]   [facility] ([place].,  [place], [place] [phone] or [phone]   [facility]idential Treatment Services (RTS) [address] [place], [place] Accepts Medicaid  [facility] [place] [address],  [place] [phone] Substance Abuse/Addiction Treatment   [facility] Organization         Address  Phone  Notes  [facility]  [phone]   Domenic Schwab, PhD [address] Arlis [place], [place]   [phone] or [phone]   [place], [place] [phone]   [facility] [address] [place], [place] [phone] Insurance/Medicaid/sponsorship through [facility] and Families [address], [address] [place]                                    [place], [place] [phone] [place] [address], [place] [phone]    Dr. Adele Schilder  [phone]   [facility] Dept. 1) [address], [place] 2) [place] 3)  [place] [address], [place] [phone]  [phone]   [place] [phone] or [phone] (After Hours)

## 2015-06-22 NOTE — ED Notes (Signed)
Pt directed to pharmacy for medications

## 2015-08-14 ENCOUNTER — Encounter (HOSPITAL_BASED_OUTPATIENT_CLINIC_OR_DEPARTMENT_OTHER): Payer: Self-pay | Admitting: *Deleted

## 2015-08-14 ENCOUNTER — Emergency Department (HOSPITAL_BASED_OUTPATIENT_CLINIC_OR_DEPARTMENT_OTHER)
Admission: EM | Admit: 2015-08-14 | Discharge: 2015-08-14 | Disposition: A | Discharge status: Home / Self Care | Payer: Self-pay | Attending: Emergency Medicine | Admitting: Emergency Medicine

## 2015-08-14 DIAGNOSIS — Z87891 Personal history of nicotine dependence: Secondary | ICD-10-CM | POA: Insufficient documentation

## 2015-08-14 DIAGNOSIS — Z8781 Personal history of (healed) traumatic fracture: Secondary | ICD-10-CM | POA: Insufficient documentation

## 2015-08-14 DIAGNOSIS — J029 Acute pharyngitis, unspecified: Secondary | ICD-10-CM | POA: Insufficient documentation

## 2015-08-14 LAB — RAPID STREP SCREEN (MED CTR MEBANE ONLY): Streptococcus, Group A Screen (Direct): NEGATIVE

## 2015-08-14 MED ORDER — LIDOCAINE VISCOUS 2 % MT SOLN: 20.0000 mL | OROMUCOSAL | Status: DC | PRN

## 2015-08-14 NOTE — ED Notes (Signed)
States her throat hurts when she swallows. She was treated for thrush and finished the medication yesterday.

## 2015-08-14 NOTE — ED Provider Notes (Signed)
CSN: [id number]     Arrival date & time 08/14/15  1154 History   First MD Initiated Contact with Patient 08/14/15 1233     Chief Complaint  Patient presents with  . Sore Throat    (Consider location/radiation/quality/duration/timing/severity/associated sxs/prior Treatment) Patient is a 35 y.o. female presenting with pharyngitis. The history is provided by the patient. No language interpreter was used.  Sore Throat Associated symptoms include a sore throat.  Miss Meredeth IdeFleming is a 35 year old female with no significant past medical history who presents for gradual onset sore throat x 3 days. Worse with swallowing. Reports something white stuck on the back of her tongue. She was recently treated for thrush and finished medication yesterday. Denies any other symptoms including fever, chills, cough, nasal congestion, rhinorrhea, shortness of breath, nausea, or vomiting.  Past Medical History  Diagnosis Date  . Corneal abrasion    History reviewed. No pertinent past surgical history. No family history on file. Social History  Substance Use Topics  . Smoking status: Former Games developermoker  . Smokeless tobacco: None  . Alcohol Use: No   OB History    No data available     Review of Systems  HENT: Positive for sore throat.       Allergies  Review of patient's allergies indicates no known allergies.  Home Medications   Prior to Admission medications   Medication Sig Start Date End Date Taking? Authorizing Provider  lidocaine (XYLOCAINE) 2 % solution Use as directed 20 mLs in the mouth or throat as needed for mouth pain. 08/14/15   Jessee Mezera Patel-Mills, PA-C  naphazoline-pheniramine (NAPHCON-A) 0.025-0.3 % ophthalmic solution Place 1 drop into both eyes every 6 (six) hours as needed for irritation. 06/22/15   Barrett HenleNicole Elizabeth Nadeau, PA-C  UNKNOWN TO PATIENT abx for vaginal yeast infection    Historical Provider, MD   BP 124/95 mmHg  Pulse 75  Temp(Src) 98.9 F (37.2 C) (Oral)  Resp 18  Ht 5'  6" (1.676 m)  Wt 146.965 kg  BMI 52.32 kg/m2  SpO2 98% Physical Exam  Constitutional: She is oriented to person, place, and time. She appears well-developed and well-nourished.  Eyes: Conjunctivae are normal.  Neck: Normal range of motion. Neck supple.  No anterior cervical lymphadenopathy. No drooling or trismus. Uvula midline. No tonsillar edema or exudates. Oropharynx is clear and moist.  No thrush.  Cardiovascular: Normal rate.   Pulmonary/Chest: Effort normal. No respiratory distress.  Neurological: She is alert and oriented to person, place, and time.  Skin: Skin is warm and dry.  Nursing note and vitals reviewed.   ED Course  Procedures (including critical care time) Labs Review Labs Reviewed  RAPID STREP SCREEN (NOT AT [facility])  CULTURE, GROUP A STREP [facility])    Imaging Review No results found. I have personally reviewed and evaluated these lab results as part of my medical decision-making.   EKG Interpretation None      MDM   Final diagnoses:  Pharyngitis   Patient presents for sore throat 3 days. Well-appearing and in no acute distress. Vitals stable. No other symptoms. Strep is negative. This is most likely viral. She is tolerating by mouth fluids in the ED without difficulty. I discussed return precautions as well as [facility]. Patient agrees with plan. Filed Vitals:   08/14/15 1159  BP: 124/95  Pulse: 75  Temp: 98.9 F (37.2 C)  Resp: [address]      Keora Eccleston Patel-Mills, PA-C 08/14/15 1308  Vanetta MuldersScott Zackowski, MD 08/17/15 1235

## 2015-08-14 NOTE — ED Notes (Signed)
Pt directed to pharmacy to pick up medications 

## 2015-08-14 NOTE — Discharge Instructions (Signed)
Pharyngitis Follow[facility]up with [facility]. Used lidocaine as needed for sore throat. Return for inability to tolerate fluids. Pharyngitis is redness, pain, and swelling (inflammation) of your pharynx.  CAUSES  Pharyngitis is usually caused by infection. Most of the time, these infections are from viruses (viral) and are part of a cold. However, sometimes pharyngitis is caused by bacteria (bacterial). Pharyngitis can also be caused by allergies. Viral pharyngitis may be spread from person to person by coughing, sneezing, and personal items or utensils (cups, forks, spoons, toothbrushes). Bacterial pharyngitis may be spread from person to person by more intimate contact, such as kissing.  SIGNS AND SYMPTOMS  Symptoms of pharyngitis include:   Sore throat.   Tiredness (fatigue).   Low-grade fever.   Headache.  Joint pain and muscle aches.  Skin rashes.  Swollen lymph nodes.  Plaque-like film on throat or tonsils (often seen with bacterial pharyngitis). DIAGNOSIS  Your health care provider will ask you questions about your illness and your symptoms. Your medical history, along with a physical exam, is often all that is needed to diagnose pharyngitis. Sometimes, a rapid strep test is done. Other lab tests may also be done, depending on the suspected cause.  TREATMENT  Viral pharyngitis will usually get better in 3-4 days without the use of medicine. Bacterial pharyngitis is treated with medicines that kill germs (antibiotics).  HOME CARE INSTRUCTIONS   Drink enough water and fluids to keep your urine clear or pale yellow.   Only take over-the-counter or prescription medicines as directed by your health care provider:   If you are prescribed antibiotics, make sure you finish them even if you start to feel better.   Do not take aspirin.   Get lots of rest.   Gargle with 8 oz of salt water ( tsp of salt per 1 qt of water) as often as every 1-2 hours to soothe your throat.    Throat lozenges (if you are not at risk for choking) or sprays may be used to soothe your throat. SEEK MEDICAL CARE IF:   You have large, tender lumps in your neck.  You have a rash.  You cough up green, yellow-brown, or bloody spit. SEEK IMMEDIATE MEDICAL CARE IF:   Your neck becomes stiff.  You drool or are unable to swallow liquids.  You vomit or are unable to keep medicines or liquids down.  You have severe pain that does not go away with the use of recommended medicines.  You have trouble breathing (not caused by a stuffy nose). MAKE SURE YOU:   Understand these instructions.  Will watch your condition.  Will get help right away if you are not doing well or get worse.   This information is not intended to replace advice given to you by your health care provider. Make sure you discuss any questions you have with your health care provider.   Document Released: 05/23/2005 Document Revised: 03/13/2013 Document Reviewed: 01/28/2013 Elsevier Interactive Patient Education [employer]2016 Elsevier Inc.

## 2015-08-15 LAB — CULTURE, GROUP A STREP (THRC)

## 2015-08-17 ENCOUNTER — Telehealth (HOSPITAL_COMMUNITY): Payer: Self-pay

## 2015-08-17 NOTE — Telephone Encounter (Signed)
Post ED Visit - Positive Culture Follow-up  Culture report reviewed by antimicrobial stewardship pharmacist:  []  Christine Simpson, Pharm.D. []  Christine Simpson, Pharm.D., BCPS [x]  Christine Simpson, Pharm.D. []  Christine Simpson, Pharm.D., BCPS []  Christine Simpson, [address] BoulevardPharm.D., BCPS, AAHIVP []  Christine Simpson, Pharm.D., BCPS, AAHIVP []  Christine Simpson, Pharm.D. []  Christine Simpson, [address] BoulevardPharm.D.  Positive throat culture, Abundant streptococcus, beta hemolytic not group A OK  Christine Simpson, Christine Simpson 08/17/2015, 5:42 AM

## 2016-08-10 ENCOUNTER — Encounter (HOSPITAL_BASED_OUTPATIENT_CLINIC_OR_DEPARTMENT_OTHER): Payer: Self-pay

## 2016-08-10 ENCOUNTER — Emergency Department (HOSPITAL_BASED_OUTPATIENT_CLINIC_OR_DEPARTMENT_OTHER)
Admission: EM | Admit: 2016-08-10 | Discharge: 2016-08-10 | Disposition: A | Discharge status: Home / Self Care | Payer: Self-pay | Attending: Physician Assistant | Admitting: Physician Assistant

## 2016-08-10 DIAGNOSIS — Z87891 Personal history of nicotine dependence: Secondary | ICD-10-CM | POA: Insufficient documentation

## 2016-08-10 DIAGNOSIS — H1010 Acute atopic conjunctivitis, unspecified eye: Secondary | ICD-10-CM | POA: Insufficient documentation

## 2016-08-10 MED ORDER — ERYTHROMYCIN 5 MG/GM OP OINT
TOPICAL_OINTMENT | Freq: Once | OPHTHALMIC | Status: AC
  Administered 2016-08-10: 1 via OPHTHALMIC
  Filled 2016-08-10: qty 3.5

## 2016-08-10 MED ORDER — NAPHAZOLINE-PHENIRAMINE 0.025-0.3 % OP SOLN
1.0000 [drp] | Freq: Four times a day (QID) | OPHTHALMIC | Status: DC | PRN
  Filled 2016-08-10: qty 5

## 2016-08-10 MED ORDER — NAPHAZOLINE-PHENIRAMINE 0.025-0.3 % OP SOLN
1.0000 [drp] | OPHTHALMIC | 0 refills | Status: AC | PRN
Start: 2016-08-10 — End: ?

## 2016-08-10 NOTE — ED Provider Notes (Signed)
[facility] Provider Note   CSN: [id number] Arrival date & time: 08/10/16  1601     History   Chief Complaint Chief Complaint  Patient presents with  . Eye Drainage    HPI Christine Simpson is a 36 y.o. female.  HPI   Patient is a 36 year old female presenting with redness and eye itching for one week. Patient reports that she has it yearly . She reports that we usually give her erythromycin ointment as well as Visine and these both things both help her. Patient unable to get an ophthalmologist right now she is not have insurance.   No headaches. No blurred vision. No fevers. No eye drainage.    Past Medical History:  Diagnosis Date  . Corneal abrasion     There are no active problems to display for this patient.   History reviewed. No pertinent surgical history.  OB History    No data available       Home Medications    Prior to Admission medications   Not on File    Family History No family history on file.  Social History Social History  Substance Use Topics  . Smoking status: Former Games developer  . Smokeless tobacco: Never Used  . Alcohol use Yes     Comment: occ     Allergies   Patient has no known allergies.   Review of Systems Review of Systems  Constitutional: Negative for activity change.  HENT: Negative for congestion, drooling, ear discharge, ear pain and sore throat.   Respiratory: Negative for shortness of breath.   Cardiovascular: Negative for chest pain.  Gastrointestinal: Negative for abdominal pain.     Physical Exam Updated Vital Signs BP 140/88 (BP Location: Left Arm)   Pulse 85   Temp 98.4 F (36.9 C) (Oral)   Resp 18   Ht 5\' 5"  (1.651 m)   Wt (!) 322 lb (146.1 kg)   SpO2 98%   BMI 53.58 kg/m   Physical Exam  Constitutional: She is oriented to person, place, and time. She appears well-developed and well-nourished.  HENT:  Head: Normocephalic and atraumatic.  Eyes: Right eye exhibits no discharge. Left  eye exhibits no discharge.  Trace redness. No discharge. EOMI and PERRL.   Cardiovascular:  No murmur heard. Pulmonary/Chest: Effort normal.  Neurological: She is oriented to person, place, and time.  Skin: Skin is warm and dry. She is not diaphoretic.  Psychiatric: She has a normal mood and affect.  Nursing note and vitals reviewed.    ED Treatments / Results  Labs (all labs ordered are listed, but only abnormal results are displayed) Labs Reviewed - No data to display  EKG  EKG Interpretation None       Radiology No results found.  Procedures Procedures (including critical care time)  Medications Ordered in ED Medications - No data to display   Initial Impression / Assessment and Plan / ED Course  I have reviewed the triage vital signs and the nursing notes.  Pertinent labs & imaging results that were available during my care of the patient were reviewed by me and considered in my medical decision making (see chart for details).     PT here with bed eye and discomfort. Patient has a Visual merchandiser. Think this likely allergic. We will refill her Visine and erythromycin. I don't think this is bacterial given there is no discharge.. Not suspect preseptal orbital or orbital cellulitis. Patient could have atypically uveitis. Maryclare Labrador have her follow-up with  ophthalmology. Patient does not have contacts.  Final Clinical Impressions(s) / ED Diagnoses   Final diagnoses:  None    New Prescriptions New Prescriptions   No medications on file     Isack Lavalley Randall AnLyn Ardie Dragoo, MD 08/10/16 1653

## 2016-08-10 NOTE — Discharge Instructions (Signed)
We think this is likely allergic in nature. Please see the Visine drops that we prescribed 4 times a day until you feel improved. If he gets worse or you have crusting, use the erythromycin ointment. We will also give me the name of an ophthalmologist free to follow up as needed.

## 2016-08-10 NOTE — ED Triage Notes (Addendum)
C/o eye d/c, redness, itching x 1 week-reports hx of same yearly-NAD-steady gait

## 2017-10-10 ENCOUNTER — Emergency Department (HOSPITAL_BASED_OUTPATIENT_CLINIC_OR_DEPARTMENT_OTHER)
Admission: EM | Admit: 2017-10-10 | Discharge: 2017-10-10 | Disposition: A | Discharge status: Home / Self Care | Payer: Self-pay | Attending: Emergency Medicine | Admitting: Emergency Medicine

## 2017-10-10 ENCOUNTER — Other Ambulatory Visit: Payer: Self-pay

## 2017-10-10 ENCOUNTER — Encounter (HOSPITAL_BASED_OUTPATIENT_CLINIC_OR_DEPARTMENT_OTHER): Payer: Self-pay | Admitting: *Deleted

## 2017-10-10 DIAGNOSIS — B9789 Other viral agents as the cause of diseases classified elsewhere: Secondary | ICD-10-CM | POA: Insufficient documentation

## 2017-10-10 DIAGNOSIS — J029 Acute pharyngitis, unspecified: Secondary | ICD-10-CM | POA: Insufficient documentation

## 2017-10-10 DIAGNOSIS — Z87891 Personal history of nicotine dependence: Secondary | ICD-10-CM | POA: Insufficient documentation

## 2017-10-10 LAB — RAPID STREP SCREEN (MED CTR MEBANE ONLY): Streptococcus, Group A Screen (Direct): NEGATIVE

## 2017-10-10 MED ORDER — IBUPROFEN 400 MG PO TABS
600.0000 mg | ORAL_TABLET | Freq: Once | ORAL | Status: AC
  Administered 2017-10-10: 600 mg via ORAL
  Filled 2017-10-10: qty 1

## 2017-10-10 MED ORDER — IBUPROFEN 600 MG PO TABS: 600.0000 mg | ORAL_TABLET | Freq: Four times a day (QID) | ORAL | 0 refills | Status: AC | PRN

## 2017-10-10 MED ORDER — PHENOL 1.4 % MT LIQD: 1.0000 | OROMUCOSAL | 0 refills | Status: AC | PRN

## 2017-10-10 NOTE — Discharge Instructions (Addendum)
As discussed, your rapid strep was negative today and the most common cause of sore throat and adults tends to be viral and does not require antibiotics.  If your culture returns positive for strep we will contact you and start antibiotics.  Drink tea with honey, use cough drops, throat sprays over-the-counter as needed to help soothe your throat.  Alternate between ibuprofen and Tylenol as needed for pain and fever.  Make sure that you drink plenty of fluids and stay well-hydrated keeping your urine clear. Follow up with your primary care provider or the wellness center to establish care with a primary.  Return if symptoms worsen, facial swelling, difficulty swallowing, difficulty breathing, drooling, or other new concerning symptoms in the meantime.

## 2017-10-10 NOTE — ED Provider Notes (Signed)
[facility] EMERGENCY DEPARTMENT Provider Note   CSN: [id number] Arrival date & time: 10/10/17  1642     History   Chief Complaint Chief Complaint  Patient presents with  . Sore Throat  . Chills    HPI Christine Simpson is a 37 y.o. female with no past medical history presenting with 2 days of subjective fevers, chills, sore throat.  She denies cough, congestion or other symptoms.  She has not tried anything for her symptoms at home.  No drooling, difficulty breathing or swallowing, facial swelling or chest pain.  She denies any known ill contacts or contacts with young children.  HPI  Past Medical History:  Diagnosis Date  . Corneal abrasion     There are no active problems to display for this patient.   History reviewed. No pertinent surgical history.   OB History   None      Home Medications    Prior to Admission medications   Medication Sig Start Date End Date Taking? Authorizing Provider  ibuprofen (ADVIL,MOTRIN) 600 MG tablet Take 1 tablet (600 mg total) by mouth every 6 (six) hours as needed. 10/10/17   Georgiana Shore, PA-C  naphazoline-pheniramine (NAPHCON-A) 0.025-0.3 % ophthalmic solution Place 1 drop into both eyes every 4 (four) hours as needed for irritation. 08/10/16   Mackuen, Courteney Lyn, MD  phenol (CHLORASEPTIC) 1.4 % LIQD Use as directed 1 spray in the mouth or throat as needed for throat irritation / pain. Make sure that you do not swallow and spit up after gargling. 10/10/17   Georgiana Shore, PA-C    Family History No family history on file.  Social History Social History   Tobacco Use  . Smoking status: Former Games developer  . Smokeless tobacco: Never Used  Substance Use Topics  . Alcohol use: Yes    Comment: occ  . Drug use: No     Allergies   Patient has no known allergies.   Review of Systems Review of Systems  Constitutional: Positive for chills and fever. Negative for activity change, appetite change, diaphoresis and  fatigue.       Subjective fevers and chills  HENT: Positive for sore throat. Negative for congestion, facial swelling, tinnitus and trouble swallowing.   Respiratory: Negative for cough, choking, chest tightness, shortness of breath, wheezing and stridor.   Cardiovascular: Negative for chest pain and palpitations.  Gastrointestinal: Negative for nausea and vomiting.  Musculoskeletal: Negative for myalgias, neck pain and neck stiffness.  Skin: Negative for color change, pallor and rash.  Neurological: Negative for dizziness, light-headedness and headaches.     Physical Exam Updated Vital Signs BP (!) 138/96 (BP Location: Right Arm)   Pulse 98   Temp 99 F (37.2 C) (Oral)   Resp 18   Ht  (1.676 m)   Wt (!) 143.8 kg (317 lb)   SpO2 99%   BMI 51.17 kg/m   Physical Exam  Constitutional: She appears well-developed and well-nourished.  Non-toxic appearance. She does not appear ill. No distress.  Patient is afebrile without the use of antipyretics, well-appearing nontoxic sitting comfortably in chair no acute distress.  HENT:  Head: Normocephalic.  Mouth/Throat: Uvula is midline and mucous membranes are normal. Mucous membranes are not pale, not dry and not cyanotic. No oral lesions. No uvula swelling. Posterior oropharyngeal erythema present. No oropharyngeal exudate or posterior oropharyngeal edema. Tonsils are 1+ on the right. Tonsils are 1+ on the left. Tonsillar exudate.  No gross oral abscess. Uvula  is midline, arches are simmetrical and intact. No peritonsilar swelling. No trismus. Sublingual mucosa is soft and non-tender. Tolerating oral secretions. No concern for ludwig's angina.   Eyes: EOM are normal. Right eye exhibits no discharge. Left eye exhibits no discharge.  Neck: Normal range of motion. Neck supple.  Cardiovascular: Normal rate, regular rhythm and normal heart sounds.  Pulmonary/Chest: Effort normal and breath sounds normal. No stridor. No respiratory distress.  She has no wheezes. She has no rhonchi. She has no rales.  Abdominal: She exhibits no distension.  Lymphadenopathy:    She has cervical adenopathy.  Neurological: She is alert.  Skin: Skin is warm and dry. No rash noted. She is not diaphoretic. No erythema. No pallor.  Psychiatric: She has a normal mood and affect. Her behavior is normal.  Nursing note and vitals reviewed.    ED Treatments / Results  Labs (all labs ordered are listed, but only abnormal results are displayed) Labs Reviewed  RAPID STREP SCREEN ([facility] ONLY)  CULTURE, GROUP A STREP [facility])    EKG None  Radiology No results found.  Procedures Procedures (including critical care time)  Medications Ordered in ED Medications  ibuprofen (ADVIL,MOTRIN) tablet 600 mg (600 mg Oral Given 10/10/17 1957)     Initial Impression / Assessment and Plan / ED Course  I have reviewed the triage vital signs and the nursing notes.  Pertinent labs & imaging results that were available during my care of the patient were reviewed by me and considered in my medical decision making (see chart for details).    Pt presents afebrile with mild left tonsillar exudate, negative strep. mild cervical lymphadenopathy, & odynophagia; diagnosis of viral pharyngitis. No abx indicated. DC w symptomatic tx for pain. Patient has not tried anything for her symptoms.  Options for symptomatic relief were discussed.  Pt does not appear dehydrated, but did discuss importance of water rehydration. Presentation non concerning for PTA or infxn spread to soft tissue. No trismus or uvula deviation. Specific return precautions discussed. Pt able to drink water in ED without difficulty with intact airway.   Discharge home with symptomatic relief and close follow-up with PCP. Discussed strict return precautions and advised to return to the emergency department if experiencing any new or worsening symptoms. Instructions were understood and patient agreed with  discharge plan.  Final Clinical Impressions(s) / ED Diagnoses   Final diagnoses:  Viral pharyngitis    ED Discharge Orders        Ordered    ibuprofen (ADVIL,MOTRIN) 600 MG tablet  Every 6 hours PRN     10/10/17 1954    phenol (CHLORASEPTIC) 1.4 % LIQD  As needed     10/10/17 1954       Gregary Cromer 10/10/17 2008    Nira Conn, MD 10/11/17 [phone]

## 2017-10-10 NOTE — ED Triage Notes (Signed)
Sore throat and chills x2 days

## 2017-10-11 ENCOUNTER — Emergency Department (HOSPITAL_BASED_OUTPATIENT_CLINIC_OR_DEPARTMENT_OTHER)
Admission: EM | Admit: 2017-10-11 | Discharge: 2017-10-11 | Disposition: A | Discharge status: Home / Self Care | Payer: Self-pay | Attending: Emergency Medicine | Admitting: Emergency Medicine

## 2017-10-11 ENCOUNTER — Emergency Department (HOSPITAL_BASED_OUTPATIENT_CLINIC_OR_DEPARTMENT_OTHER): Payer: Self-pay

## 2017-10-11 ENCOUNTER — Encounter (HOSPITAL_BASED_OUTPATIENT_CLINIC_OR_DEPARTMENT_OTHER): Payer: Self-pay | Admitting: *Deleted

## 2017-10-11 DIAGNOSIS — J029 Acute pharyngitis, unspecified: Secondary | ICD-10-CM | POA: Insufficient documentation

## 2017-10-11 DIAGNOSIS — Z79899 Other long term (current) drug therapy: Secondary | ICD-10-CM | POA: Insufficient documentation

## 2017-10-11 DIAGNOSIS — Z87891 Personal history of nicotine dependence: Secondary | ICD-10-CM | POA: Insufficient documentation

## 2017-10-11 LAB — BASIC METABOLIC PANEL
ANION GAP: 11 (ref 5–15)
BUN: 13 mg/dL (ref 6–20)
CHLORIDE: 102 mmol/L (ref 101–111)
CO2: 23 mmol/L (ref 22–32)
Calcium: 8.5 mg/dL — ABNORMAL LOW (ref 8.9–10.3)
Creatinine, Ser: 0.86 mg/dL (ref 0.44–1.00)
GFR calc non Af Amer: >60 mL/min (ref >60)
Glucose, Bld: 109 mg/dL — ABNORMAL HIGH (ref 65–99)
POTASSIUM: 3.6 mmol/L (ref 3.5–5.1)
SODIUM: 136 mmol/L (ref 135–145)

## 2017-10-11 LAB — CBC WITH DIFFERENTIAL/PLATELET
BASOS ABS: 0.1 10*3/uL (ref 0.0–0.1)
BASOS PCT: 1 %
EOS ABS: 0.2 10*3/uL (ref 0.0–0.7)
Eosinophils Relative: 2 %
HEMATOCRIT: 42.2 % (ref 36.0–46.0)
HEMOGLOBIN: 13.9 g/dL (ref 12.0–15.0)
Lymphocytes Relative: 29 %
Lymphs Abs: 2.9 10*3/uL (ref 0.7–4.0)
MCH: 27.8 pg (ref 26.0–34.0)
MCHC: 32.9 g/dL (ref 30.0–36.0)
MCV: 84.4 fL (ref 78.0–100.0)
MONOS PCT: 14 %
Monocytes Absolute: 1.4 10*3/uL — ABNORMAL HIGH (ref 0.1–1.0)
NEUTROS ABS: 5.4 10*3/uL (ref 1.7–7.7)
Neutrophils Relative %: 54 %
Platelets: 374 10*3/uL (ref 150–400)
RBC: 5 MIL/uL (ref 3.87–5.11)
RDW: 13.6 % (ref 11.5–15.5)
WBC: 9.8 10*3/uL (ref 4.0–10.5)

## 2017-10-11 MED ORDER — DEXAMETHASONE 1 MG/ML PO CONC
10.0000 mg | Freq: Once | ORAL | Status: AC
  Administered 2017-10-11: 10 mg via ORAL
  Filled 2017-10-11: qty 10

## 2017-10-11 MED ORDER — IOPAMIDOL (ISOVUE-300) INJECTION 61%
100.0000 mL | Freq: Once | INTRAVENOUS | Status: AC | PRN
  Administered 2017-10-11: 75 mL via INTRAVENOUS

## 2017-10-11 MED ORDER — DIPHENHYDRAMINE HCL 12.5 MG/5ML PO ELIX
25.0000 mg | ORAL_SOLUTION | Freq: Once | ORAL | Status: AC
  Administered 2017-10-11: 25 mg via ORAL
  Filled 2017-10-11: qty 10

## 2017-10-11 MED ORDER — AMOXICILLIN 500 MG PO CAPS: 500.0000 mg | ORAL_CAPSULE | Freq: Two times a day (BID) | ORAL | 0 refills | Status: AC

## 2017-10-11 MED ORDER — ACETAMINOPHEN 325 MG PO TABS: 650.0000 mg | ORAL_TABLET | Freq: Four times a day (QID) | ORAL | 0 refills | Status: AC | PRN

## 2017-10-11 MED ORDER — DIPHENHYDRAMINE HCL 25 MG PO TABS: 25.0000 mg | ORAL_TABLET | Freq: Four times a day (QID) | ORAL | 0 refills | Status: DC

## 2017-10-11 NOTE — ED Notes (Signed)
Pt drinking water, also given apple juice.

## 2017-10-11 NOTE — ED Triage Notes (Signed)
Pt c/o increased sore throat x 4 days , seen here yesterday for same

## 2017-10-11 NOTE — ED Notes (Signed)
Pt c/o throat pain and inability to swallow.  Pt was able to drink a bottle of water in front of nurse without an issue.  She states that after she received ibuprofen yesterday, she "broke out" in her mouth.  Pt has not taken anything since her visit to the ED yesterday and has not filled her prescriptions.

## 2017-10-11 NOTE — ED Provider Notes (Signed)
[facility] EMERGENCY DEPARTMENT Provider Note   CSN: [id number] Arrival date & time: 10/11/17  1438     History   Chief Complaint Chief Complaint  Patient presents with  . Sore Throat    HPI Christine Simpson is a 37 y.o. female with no past medical history presenting with what she believes is a reaction to the ibuprofen that was given last night in the emergency department.  States that she feels like her tongue is swollen and she cannot drink water and has been itching in her lower extremities. She was in the emergency department yesterday for sore throat and prescribed ibuprofen and throat spray to help with her symptoms.  She states that she did not pick it up as she thinks that she reacted to the ibuprofen yesterday.  She denies any known allergy.  She is unsure if she has ever taken ibuprofen in the past but states if she did it was here as she never purchases it.  HPI  Past Medical History:  Diagnosis Date  . Corneal abrasion     There are no active problems to display for this patient.   History reviewed. No pertinent surgical history.   OB History   None      Home Medications    Prior to Admission medications   Medication Sig Start Date End Date Taking? Authorizing Provider  acetaminophen (TYLENOL) 325 MG tablet Take 2 tablets (650 mg total) by mouth every 6 (six) hours as needed for mild pain or moderate pain. 10/11/17   Mathews Robinsons B, PA-C  amoxicillin (AMOXIL) 500 MG capsule Take 1 capsule (500 mg total) by mouth 2 (two) times daily for 7 days. 10/11/17 10/18/17  Georgiana Shore, PA-C  diphenhydrAMINE (BENADRYL) 25 MG tablet Take 1 tablet (25 mg total) by mouth every 6 (six) hours. 10/11/17   Georgiana Shore, PA-C  ibuprofen (ADVIL,MOTRIN) 600 MG tablet Take 1 tablet (600 mg total) by mouth every 6 (six) hours as needed. 10/10/17   Georgiana Shore, PA-C  naphazoline-pheniramine (NAPHCON-A) 0.025-0.3 % ophthalmic solution Place 1 drop into both  eyes every 4 (four) hours as needed for irritation. 08/10/16   Mackuen, Courteney Lyn, MD  phenol (CHLORASEPTIC) 1.4 % LIQD Use as directed 1 spray in the mouth or throat as needed for throat irritation / pain. Make sure that you do not swallow and spit up after gargling. 10/10/17   Georgiana Shore PA-C    Family History History reviewed. No pertinent family history.  Social History Social History   Tobacco Use  . Smoking status: Former Games developer  . Smokeless tobacco: Never Used  Substance Use Topics  . Alcohol use: Yes    Comment: occ  . Drug use: No     Allergies   Patient has no known allergies.   Review of Systems Review of Systems  Constitutional: Negative for chills, diaphoresis, fatigue and fever.  HENT: Positive for facial swelling, sore throat, trouble swallowing and voice change. Negative for ear discharge, ear pain and tinnitus.   Respiratory: Negative for cough, choking, chest tightness, shortness of breath and stridor.   Gastrointestinal: Negative for nausea and vomiting.  Musculoskeletal: Negative for myalgias, neck pain and neck stiffness.  Skin: Negative for color change, pallor and rash.  Neurological: Negative for dizziness, light-headedness and headaches.     Physical Exam Updated Vital Signs BP 127/77 (BP Location: Left Arm)   Pulse 94   Temp 98.6 F (37 C) (Oral)   Resp  18   Ht  (1.676 m)   Wt (!) 143.8 kg (317 lb)   SpO2 100%   BMI 51.17 kg/m   Physical Exam  Constitutional: She appears well-developed and well-nourished.  Non-toxic appearance. She does not appear ill. No distress.  Afebrile, nontoxic-appearing, her voice sounds somewhat more muffled than yesterday.   HENT:  Head: Normocephalic.  Mouth/Throat: Uvula is midline and mucous membranes are normal. No uvula swelling. Posterior oropharyngeal erythema present. No oropharyngeal exudate or tonsillar abscesses. Tonsillar exudate.  Tongue appears slightly swollen, non-tender.  Sublingual mucosa is soft and non-tender  Eyes: EOM are normal. Right eye exhibits no discharge. Left eye exhibits no discharge.  Neck: Normal range of motion. Neck supple.  Cardiovascular: Normal rate and normal heart sounds.  Pulmonary/Chest: Effort normal and breath sounds normal. No stridor. No respiratory distress. She has no wheezes. She has no rhonchi. She has no rales.  Lymphadenopathy:    She has cervical adenopathy.  Neurological: She is alert.  Skin: Skin is warm and dry. No rash noted. She is not diaphoretic. No erythema. No pallor.  Psychiatric: She has a normal mood and affect. Her behavior is normal.  Nursing note and vitals reviewed.    ED Treatments / Results  Labs (all labs ordered are listed, but only abnormal results are displayed) Labs Reviewed  CBC WITH DIFFERENTIAL/PLATELET - Abnormal; Notable for the following components:      Result Value   Monocytes Absolute 1.4 (*)    All other components within normal limits  BASIC METABOLIC PANEL - Abnormal; Notable for the following components:   Glucose, Bld 109 (*)    Calcium 8.5 (*)    All other components within normal limits    EKG None  Radiology Ct Soft Tissue Neck W Contrast  Result Date: 10/11/2017 CLINICAL DATA:  37 y/o F; neck swelling, pain, trouble swallowing for 3 days. EXAM: CT NECK WITH CONTRAST TECHNIQUE: Multidetector CT imaging of the neck was performed using the standard protocol following the bolus administration of intravenous contrast. CONTRAST:  75mL ISOVUE-300 IOPAMIDOL (ISOVUE-300) INJECTION 61% COMPARISON:  None. FINDINGS: Pharynx and larynx: Mild enlargement of adenoid and oropharyngeal tonsils as well as oropharyngeal mucosal thickening. No peritonsillar abscess or inflammation in the surrounding deep cervical compartments. Salivary glands: No inflammation, mass, or stone. Thyroid: Normal. Lymph nodes: Bilateral anterior upper cervical lymphadenopathy without lymph node necrosis. Vascular:  Negative. Limited intracranial: Negative. Visualized orbits: Negative. Mastoids and visualized paranasal sinuses: Clear. Skeleton: No acute or aggressive process. Upper chest: Negative. Other: None. IMPRESSION: 1. Mild enlargement of adenoid and oropharyngeal tonsils as well as oropharyngeal mucosal thickening compatible with pharyngitis. No peritonsillar abscess or inflammation in the surrounding deep cervical compartments. 2. Anterior upper cervical lymphadenopathy, likely reactive. Electronically Signed   By: Mitzi Hansen M.D.   On: 10/11/2017 18:38    Procedures Procedures (including critical care time)  Medications Ordered in ED Medications  dexamethasone (DECADRON) 1 MG/ML solution 10 mg (10 mg Oral Given 10/11/17 1659)  diphenhydrAMINE (BENADRYL) 12.5 MG/5ML elixir 25 mg (25 mg Oral Given 10/11/17 1659)  iopamidol (ISOVUE-300) 61 % injection 100 mL (75 mLs Intravenous Contrast Given 10/11/17 1811)     Initial Impression / Assessment and Plan / ED Course  I have reviewed the triage vital signs and the nursing notes.  Pertinent labs & imaging results that were available during my care of the patient were reviewed by me and considered in my medical decision making (see chart for details).  Patient returns with tongue swelling and difficulty swallowing as well as pruritus.  Seen by me in the emergency department last night for sore throat with negative strep.  She believes that she had a reaction to the ibuprofen that was given while she was in the department.  Voice appears slightly more muffled than yesterday and some swelling to the left side of the tongue.  Patient was given steroid and Benadryl and will image her neck to ensure no infectious spread to soft tissue.On reassessment, patient reported significant improvement and was drinking fluids without difficulties. She was ready to go home and get something to eat.  Patient did have mild tonsillar exudate with negative  strep. Sublingual mucosa is soft and nontender and patient is tolerating oral secretions.  CT soft tissue negative for soft tissue infection spread, findings consistent with pharyngitis/tonsilitis Labs unremarkable.  Patient will be discharged home with amoxicillin, symptomatic relief and close follow-up with PCP.  Return precautions discussed and patient understood and agrees with plan.  Final Clinical Impressions(s) / ED Diagnoses   Final diagnoses:  Acute pharyngitis, unspecified etiology    ED Discharge Orders        Ordered    acetaminophen (TYLENOL) 325 MG tablet  Every 6 hours PRN     10/11/17 1858    amoxicillin (AMOXIL) 500 MG capsule  2 times daily     10/11/17 1858    diphenhydrAMINE (BENADRYL) 25 MG tablet  Every 6 hours     10/11/17 1859       Georgiana Shore, PA-C 10/11/17 Gregery Na, MD 10/17/17 [phone]

## 2017-10-11 NOTE — Discharge Instructions (Signed)
As discussed, your CT scan did not show any evidence of soft tissue infection spread. Take your entire course of antibiotics even if you feel better, drink plenty of fluids to keep your urine clear. Take tylenol every 6 hours for pain or fever.  Make an appointment with your primary care provider for follow up or make an appointment with the wellness center to establish care with a primary care provider. Take benadryl or zyrtec as needed for itching. Return if symptoms worsen, swelling, drooling, difficulty breathing, muffled voice or other new concerning symptoms in the meantime.

## 2017-10-13 LAB — CULTURE, GROUP A STREP (THRC)

## 2017-10-13 MED FILL — ACETAMINOPHEN 325 MG TABLET: 325 | 12 days supply | Qty: 100 | Fill #0

## 2017-10-13 MED FILL — BANOPHEN 25 MG CAPSULE: 25 | 25 days supply | Qty: 100 | Fill #0

## 2017-10-13 MED FILL — AMOXICILLIN 500 MG CAPSULE: 500 | 7 days supply | Qty: 14 | Fill #0

## 2020-10-20 ENCOUNTER — Other Ambulatory Visit: Payer: Self-pay

## 2020-10-20 ENCOUNTER — Encounter (HOSPITAL_BASED_OUTPATIENT_CLINIC_OR_DEPARTMENT_OTHER): Payer: Self-pay | Admitting: Emergency Medicine

## 2020-10-20 ENCOUNTER — Emergency Department (HOSPITAL_BASED_OUTPATIENT_CLINIC_OR_DEPARTMENT_OTHER)
Admission: EM | Admit: 2020-10-20 | Discharge: 2020-10-21 | Disposition: A | Discharge status: Home / Self Care | Payer: Self-pay | Attending: Emergency Medicine | Admitting: Emergency Medicine

## 2020-10-20 DIAGNOSIS — H1045 Other chronic allergic conjunctivitis: Secondary | ICD-10-CM | POA: Insufficient documentation

## 2020-10-20 DIAGNOSIS — Z87891 Personal history of nicotine dependence: Secondary | ICD-10-CM | POA: Insufficient documentation

## 2020-10-20 MED ORDER — TETRACAINE HCL 0.5 % OP SOLN
2.0000 [drp] | Freq: Once | OPHTHALMIC | Status: AC
  Administered 2020-10-20: 2 [drp] via OPHTHALMIC
  Filled 2020-10-20: qty 4

## 2020-10-20 MED ORDER — FLUORESCEIN SODIUM 1 MG OP STRP
1.0000 | ORAL_STRIP | Freq: Once | OPHTHALMIC | Status: AC
  Administered 2020-10-20: 1 via OPHTHALMIC
  Filled 2020-10-20: qty 1

## 2020-10-20 NOTE — ED Notes (Signed)
EDP at bedside  

## 2020-10-20 NOTE — ED Triage Notes (Signed)
Pt state right eye dryness, pain and feels like something is in it X 1 month

## 2020-10-21 MED ORDER — KETOTIFEN FUMARATE 0.025 % OP SOLN: 1.0000 [drp] | Freq: Two times a day (BID) | OPHTHALMIC | 0 refills | Status: AC

## 2020-10-21 NOTE — ED Provider Notes (Signed)
[facility]PT [facility] Emergency Department Provider Note MRN:  [id number]  Arrival date & time: 10/21/20     Chief Complaint   Eye Problem   History of Present Illness   Christine Simpson is a 40 y.o. year-old female with no pertinent past medical history presenting to the ED with chief complaint of eye problem.  Bilateral eye irritation for the past month.  Thought maybe it was due to her allergies.  Denies any vision loss, no trauma to the eyes, does not wear eye contact lenses or glasses, no fever, no other symptoms.  Discomfort is mild to moderate, constant.  Review of Systems  A complete 10 system review of systems was obtained and all systems are negative except as noted in the HPI and PMH.   Patient's Health History    Past Medical History:  Diagnosis Date  . Corneal abrasion     History reviewed. No pertinent surgical history.  History reviewed. No pertinent family history.  Social History   Socioeconomic History  . Marital status: Single    Spouse name: Not on file  . Number of children: Not on file  . Years of education: Not on file  . Highest education level: Not on file  Occupational History  . Not on file  Tobacco Use  . Smoking status: Former Games developer  . Smokeless tobacco: Never Used  Substance and Sexual Activity  . Alcohol use: Yes    Comment: occ  . Drug use: No  . Sexual activity: Not on file  Other Topics Concern  . Not on file  Social History Narrative  . Not on file   Social Determinants of Health   Financial Resource Strain: Not on file  Food Insecurity: Not on file  Transportation Needs: Not on file  Physical Activity: Not on file  Stress: Not on file  Social Connections: Not on file  Intimate Partner Violence: Not on file     Physical Exam   Vitals:   10/20/20 2311  BP: (!) 155/104  Pulse: 90  Resp: 16  Temp: 98.6 F (37 C)  SpO2: 99%    CONSTITUTIONAL: Well-appearing, NAD NEURO:  Alert and oriented x 3, no  focal deficits EYES:  eyes equal and reactive, normal extraocular movements, bilateral conjunctival injection ENT/NECK:  no LAD, no JVD CARDIO: Regular rate, well-perfused, normal S1 and S2 PULM:  CTAB no wheezing or rhonchi GI/GU:  normal bowel sounds, non-distended, non-tender MSK/SPINE:  No gross deformities, no edema SKIN:  no rash, atraumatic PSYCH:  Appropriate speech and behavior  *Additional and/or pertinent findings included in MDM below  Diagnostic and Interventional Summary    EKG Interpretation  Date/Time:    Ventricular Rate:    PR Interval:    QRS Duration:   QT Interval:    QTC Calculation:   R Axis:     Text Interpretation:        Labs Reviewed - No data to display  No orders to display    Medications  tetracaine (PONTOCAINE) 0.5 % ophthalmic solution 2 drop (2 drops Right Eye Given by Other 10/20/20 2350)  fluorescein ophthalmic strip 1 strip (1 strip Right Eye Given by Other 10/20/20 2350)     Procedures  /  Critical Care Procedures  ED Course and Medical Decision Making  I have reviewed the triage vital signs, the nursing notes, and pertinent available records from the EMR.  Listed above are laboratory and imaging tests that I personally ordered, reviewed, and interpreted  and then considered in my medical decision making (see below for details).  History and exam consistent with conjunctivitis, thought to be allergic in etiology given the duration and patient's seasonal allergy history.  Bedside Woods lamp with fluorescein does not reveal any corneal ulcers or abrasions.  Normal visual acuity, nothing to suggest emergent process.  Will provide antihistamine drops, advised ophthalmology follow-up.       Elmer Sow. Pilar Plate, MD [facility] Emergency Medicine [facility] [email or web address]  Final Clinical Impressions(s) / ED Diagnoses     ICD-10-CM   1. Other chronic allergic conjunctivitis of both eyes  H10.45     ED Discharge  Orders         Ordered    ketotifen (ZADITOR) 0.025 % ophthalmic solution  2 times daily        10/21/20 0034           Discharge Instructions Discussed with and Provided to Patient:     Discharge Instructions     You were evaluated in the Emergency Department and after careful evaluation, we did not find any emergent condition requiring admission or further testing in the hospital.  Your exam/testing today was overall reassuring.  Symptoms seem to be due to an allergic conjunctivitis.  Please use the eyedrops prescribed to try to help with your symptoms.  Recommend follow-up with an ophthalmologist for further management.  Please return to the Emergency Department if you experience any worsening of your condition.  Thank you for allowing [place] to be a part of your care.       Sabas Sous, MD 10/21/20 [phone]

## 2020-10-21 NOTE — Discharge Instructions (Addendum)
You were evaluated in the Emergency Department and after careful evaluation, we did not find any emergent condition requiring admission or further testing in the hospital.  Your exam/testing today was overall reassuring.  Symptoms seem to be due to an allergic conjunctivitis.  Please use the eyedrops prescribed to try to help with your symptoms.  Recommend follow-up with an ophthalmologist for further management.  Please return to the Emergency Department if you experience any worsening of your condition.  Thank you for allowing [place] to be a part of your care.

## 2020-11-06 ENCOUNTER — Other Ambulatory Visit: Payer: Self-pay

## 2020-11-06 ENCOUNTER — Emergency Department (HOSPITAL_BASED_OUTPATIENT_CLINIC_OR_DEPARTMENT_OTHER)
Admission: EM | Admit: 2020-11-06 | Discharge: 2020-11-06 | Disposition: A | Discharge status: Home / Self Care | Payer: Self-pay | Attending: Emergency Medicine | Admitting: Emergency Medicine

## 2020-11-06 ENCOUNTER — Encounter (HOSPITAL_BASED_OUTPATIENT_CLINIC_OR_DEPARTMENT_OTHER): Payer: Self-pay

## 2020-11-06 ENCOUNTER — Emergency Department (HOSPITAL_BASED_OUTPATIENT_CLINIC_OR_DEPARTMENT_OTHER): Payer: Self-pay

## 2020-11-06 DIAGNOSIS — L03211 Cellulitis of face: Secondary | ICD-10-CM

## 2020-11-06 DIAGNOSIS — K047 Periapical abscess without sinus: Secondary | ICD-10-CM

## 2020-11-06 DIAGNOSIS — H5712 Ocular pain, left eye: Secondary | ICD-10-CM | POA: Insufficient documentation

## 2020-11-06 DIAGNOSIS — S025XXA Fracture of tooth (traumatic), initial encounter for closed fracture: Secondary | ICD-10-CM | POA: Insufficient documentation

## 2020-11-06 DIAGNOSIS — Z87891 Personal history of nicotine dependence: Secondary | ICD-10-CM | POA: Insufficient documentation

## 2020-11-06 DIAGNOSIS — W19XXXA Unspecified fall, initial encounter: Secondary | ICD-10-CM | POA: Insufficient documentation

## 2020-11-06 LAB — CBC WITH DIFFERENTIAL/PLATELET
Abs Immature Granulocytes: 0.04 10*3/uL (ref 0.00–0.07)
Basophils Absolute: 0.1 10*3/uL (ref 0.0–0.1)
Basophils Relative: 1 %
Eosinophils Absolute: 0.2 10*3/uL (ref 0.0–0.5)
Eosinophils Relative: 2 %
HCT: 43 % (ref 36.0–46.0)
Hemoglobin: 13.7 g/dL (ref 12.0–15.0)
Immature Granulocytes: 0 %
Lymphocytes Relative: 14 %
Lymphs Abs: 1.5 10*3/uL (ref 0.7–4.0)
MCH: 27.4 pg (ref 26.0–34.0)
MCHC: 31.9 g/dL (ref 30.0–36.0)
MCV: 86 fL (ref 80.0–100.0)
Monocytes Absolute: 1.1 10*3/uL — ABNORMAL HIGH (ref 0.1–1.0)
Monocytes Relative: 10 %
Neutro Abs: 7.9 10*3/uL — ABNORMAL HIGH (ref 1.7–7.7)
Neutrophils Relative %: 73 %
Platelets: 388 10*3/uL (ref 150–400)
RBC: 5 MIL/uL (ref 3.87–5.11)
RDW: 13.4 % (ref 11.5–15.5)
WBC: 10.9 10*3/uL — ABNORMAL HIGH (ref 4.0–10.5)
nRBC: 0 % (ref 0.0–0.2)

## 2020-11-06 LAB — BASIC METABOLIC PANEL
Anion gap: 7 (ref 5–15)
BUN: 12 mg/dL (ref 6–20)
CO2: 28 mmol/L (ref 22–32)
Calcium: 8.5 mg/dL — ABNORMAL LOW (ref 8.9–10.3)
Chloride: 98 mmol/L (ref 98–111)
Creatinine, Ser: 1.1 mg/dL — ABNORMAL HIGH (ref 0.44–1.00)
GFR, Estimated: >60 mL/min (ref >60)
Glucose, Bld: 177 mg/dL — ABNORMAL HIGH (ref 70–99)
Potassium: 3.8 mmol/L (ref 3.5–5.1)
Sodium: 133 mmol/L — ABNORMAL LOW (ref 135–145)

## 2020-11-06 LAB — HCG, SERUM, QUALITATIVE: Preg, Serum: NEGATIVE

## 2020-11-06 MED ORDER — CLINDAMYCIN HCL 300 MG PO CAPS: 300.0000 mg | ORAL_CAPSULE | Freq: Four times a day (QID) | ORAL | 0 refills | Status: DC

## 2020-11-06 MED ORDER — ACETAMINOPHEN 325 MG PO TABS
650.0000 mg | ORAL_TABLET | Freq: Once | ORAL | Status: AC
  Administered 2020-11-06: 650 mg via ORAL
  Filled 2020-11-06: qty 2

## 2020-11-06 MED ORDER — SODIUM CHLORIDE 0.9 % IV SOLN: INTRAVENOUS | Status: DC | PRN

## 2020-11-06 MED ORDER — CLINDAMYCIN PHOSPHATE 600 MG/50ML IV SOLN
600.0000 mg | Freq: Once | INTRAVENOUS | Status: AC
  Administered 2020-11-06: 600 mg via INTRAVENOUS
  Filled 2020-11-06: qty 50

## 2020-11-06 NOTE — Discharge Instructions (Addendum)
You were seen in the ER today for your facial swelling and dental infection.  Your blood work and CT scan were very reassuring.  There is no infection extending into your eye socket.  There is an abscess above your broken tooth, the changes in your cheek are inflammatory secondary to this infection.  You were administered your first dose of antibiotics through your IV in the emergency department, and you have been prescribed a course of antibiotics with clindamycin outpatient.  Take this antibiotic as prescribed 4 times a day for 10 days. Please follow-up closely with your dentist, and return to the emergency department sooner if you develop any worsening pain in your left eye, difficulty swallowing, fevers and chills despite antibiotics, nausea or vomiting does not stop, or any other new severe symptoms.

## 2020-11-06 NOTE — ED Triage Notes (Signed)
Pt c/o left upper dental pain x 5 days-left facial swelling x 3 days-NAD-steady git

## 2020-11-06 NOTE — ED Provider Notes (Signed)
[facility] EMERGENCY DEPARTMENT Provider Note   CSN: [id number] Arrival date & time: 11/06/20  1837     History Chief Complaint  Patient presents with  . Dental Pain    Christine Simpson is a 40 y.o. female who presents with concern for 5 days of left upper dental pain.  Patient states that her tooth broke on Sunday and she fell Monday with swelling of the left side of the face and dental pain.  She called the dentist and was able to be seen on Thursday, at that time they found her to have dental infection and was not willing to perform repair at that time.  She was discharged from the dentist with oral amoxicillin prescription, however does have progressive left-sided facial swelling and pain now extending up to around the left eye.  She does endorse some pain in the left eye.  Endorses fevers and chills at home but denies any nausea, vomiting, diarrhea, headache, blurry vision, or double vision.  I personally reviewed this patient's medical records.  She has history of morbid obesity, otherwise does not carry medical diagnoses and is not on any medications every day.   HPI     Past Medical History:  Diagnosis Date  . Corneal abrasion     There are no problems to display for this patient.   History reviewed. No pertinent surgical history.   OB History   No obstetric history on file.     No family history on file.  Social History   Tobacco Use  . Smoking status: Former Games developermoker  . Smokeless tobacco: Never Used  Substance Use Topics  . Alcohol use: Yes    Comment: occ  . Drug use: No    Home Medications Prior to Admission medications   Medication Sig Start Date End Date Taking? Authorizing Provider  acetaminophen (TYLENOL) 325 MG tablet Take 2 tablets (650 mg total) by mouth every 6 (six) hours as needed for mild pain or moderate pain. 10/11/17   Georgiana ShoreMitchell, Jessica B, PA-C  diphenhydrAMINE (BENADRYL) 25 MG tablet Take 1 tablet (25 mg total) by mouth every 6  (six) hours. 10/11/17   Georgiana ShoreMitchell, Jessica B, PA-C  ibuprofen (ADVIL,MOTRIN) 600 MG tablet Take 1 tablet (600 mg total) by mouth every 6 (six) hours as needed. 10/10/17   Georgiana ShoreMitchell, Jessica B, PA-C  ketotifen (ZADITOR) 0.025 % ophthalmic solution Place 1 drop into both eyes 2 (two) times daily. 10/21/20   Sabas SousBero, Michael M, MD  naphazoline-pheniramine (NAPHCON-A) 0.025-0.3 % ophthalmic solution Place 1 drop into both eyes every 4 (four) hours as needed for irritation. 08/10/16   Mackuen, Courteney Lyn, MD  phenol (CHLORASEPTIC) 1.4 % LIQD Use as directed 1 spray in the mouth or throat as needed for throat irritation / pain. Make sure that you do not swallow and spit up after gargling. 10/10/17   Georgiana ShoreMitchell, Jessica B, PA-C    Allergies    Patient has no known allergies.  Review of Systems   Review of Systems  Constitutional: Positive for appetite change, chills and fever. Negative for activity change and fatigue.  HENT: Positive for dental problem and facial swelling. Negative for congestion, drooling, ear discharge, ear pain, hearing loss, nosebleeds, rhinorrhea, sinus pressure, sinus pain, sneezing, sore throat, trouble swallowing and voice change.   Eyes: Positive for pain. Negative for photophobia, discharge, redness, itching and visual disturbance.  Respiratory: Negative.   Cardiovascular: Negative.   Gastrointestinal: Negative.   Genitourinary: Negative.   Musculoskeletal: Negative.  Negative  for neck pain and neck stiffness.  Neurological: Negative.     Physical Exam Updated Vital Signs BP 137/88   Pulse (!) 105   Temp 100.2 F (37.9 C) (Oral)   Resp 18   Ht 5\' 5"  (1.651 m)   Wt (!) 138.3 kg   LMP 10/19/2020   SpO2 95%   BMI 50.75 kg/m   Physical Exam Vitals and nursing note reviewed.  Constitutional:      General: She is not in acute distress.    Appearance: She is morbidly obese. She is not toxic-appearing.  HENT:     Head: Normocephalic and atraumatic.      Right Ear: Tympanic  membrane normal. No mastoid tenderness.     Left Ear: Tympanic membrane normal. No mastoid tenderness.     Nose: Nose normal.     Mouth/Throat:     Mouth: Mucous membranes are moist.     Dentition: Abnormal dentition.     Pharynx: Oropharynx is clear. Uvula midline. No oropharyngeal exudate, posterior oropharyngeal erythema or uvula swelling.     Tonsils: No tonsillar exudate.      Comments: No sublingual or submental tenderness to palpation, no sign of oropharyngeal abscess.  No lingual edema to suggest Ludwick's angina. Eyes:     General: Lids are normal. Vision grossly intact.        Right eye: No discharge.        Left eye: No discharge.     Extraocular Movements: Extraocular movements intact.     Conjunctiva/sclera: Conjunctivae normal.     Pupils: Pupils are equal, round, and reactive to light.     Comments: Patient endorses pain in the left eye with EOMs.  There is no sign of entrapment on clinical exam.   Neck:     Trachea: Trachea and phonation normal.  Cardiovascular:     Rate and Rhythm: Normal rate and regular rhythm.     Pulses: Normal pulses.     Heart sounds: Normal heart sounds. No murmur heard.   Pulmonary:     Effort: Pulmonary effort is normal. No tachypnea, bradypnea, accessory muscle usage or respiratory distress.     Breath sounds: Normal breath sounds. No wheezing or rales.  Chest:     Chest wall: No mass, lacerations, deformity, swelling, tenderness or crepitus.  Abdominal:     General: Bowel sounds are normal. There is no distension.     Palpations: Abdomen is soft.     Tenderness: There is no abdominal tenderness. There is no right CVA tenderness or left CVA tenderness.  Musculoskeletal:        General: No deformity.     Cervical back: Normal range of motion and neck supple. No edema, rigidity or crepitus. No pain with movement, spinous process tenderness or muscular tenderness.     Right lower leg: No edema.     Left lower leg: No edema.   Lymphadenopathy:     Cervical: No cervical adenopathy.  Skin:    General: Skin is warm and dry.  Neurological:     Mental Status: She is alert and oriented to person, place, and time. Mental status is at baseline.  Psychiatric:        Mood and Affect: Mood normal.     ED Results / Procedures / Treatments   Labs (all labs ordered are listed, but only abnormal results are displayed) Labs Reviewed  CBC WITH DIFFERENTIAL/PLATELET - Abnormal; Notable for the following components:      Result  Value   WBC 10.9 (*)    Neutro Abs 7.9 (*)    Monocytes Absolute 1.1 (*)    All other components within normal limits  BASIC METABOLIC PANEL - Abnormal; Notable for the following components:   Sodium 133 (*)    Glucose, Bld 177 (*)    Creatinine, Ser 1.10 (*)    Calcium 8.5 (*)    All other components within normal limits  HCG, SERUM, QUALITATIVE    EKG None  Radiology CT Maxillofacial Wo Contrast  Result Date: 11/06/2020 CLINICAL DATA:  Dental infection.  Facial swelling on the left. EXAM: CT MAXILLOFACIAL WITHOUT CONTRAST TECHNIQUE: Multidetector CT imaging of the maxillofacial structures was performed. Multiplanar CT image reconstructions were also generated. COMPARISON:  10/11/2017 FINDINGS: Osseous: No significant mandibular dental disease. There may be some scattered caries. Maxillary dentition shows a root abscess at maxillary tooth 10 with anterior cortical breakthrough, likely the source of the facial inflammation. Orbits: Pre septal orbital soft tissue swelling on the left. No postseptal orbital inflammation. Sinuses: Mild mucosal thickening of the left maxillary sinus. Other sinuses are clear. Soft tissues: Mild soft tissue edema pattern of the left face and left cheek. Moderate periorbital soft tissue edema on the left. Limited intracranial: Normal IMPRESSION: Root abscess at maxillary tooth 10 with anterior cortical breakthrough. Subsequent left facial inflammatory change without  evidence of a soft tissue abscess. Preseptal periorbital edema on the left without evidence of postseptal involvement. Electronically Signed   By: Paulina Fusi M.D.   On: 11/06/2020 20:08    Procedures Procedures   Medications Ordered in ED Medications  0.9 %  sodium chloride infusion (has no administration in time range)  clindamycin (CLEOCIN) IVPB 600 mg (600 mg Intravenous New Bag/Given 11/06/20 1938)  acetaminophen (TYLENOL) tablet 650 mg (650 mg Oral Given 11/06/20 1938)    ED Course  I have reviewed the triage vital signs and the nursing notes.  Pertinent labs & imaging results that were available during my care of the patient were reviewed by me and considered in my medical decision making (see chart for details).    MDM Rules/Calculators/A&P                         40 year old female presents with left-sided facial pain and swelling after known broken tooth.  Differential diagnosis includes is not limited to periapical abscess, odontogenic infection, Ludwick's angina, peritonsillar abscess, facial cellulitis, preseptal or orbital cellulitis.  Borderline febrile with temperature 100.2 F on intake, tachycardic and hypertensive.  At time of my exam, heart rate of 102, cardiopulmonary exam otherwise normal.  Abdominal exam is benign.  HEENT exam did feel edema and erythema over the left cheek extending up over the zygoma and around the left orbit.  Pain with EOMs in the left eye.  Fractured tooth in the mouth with surrounding periapical, gingival, buccal mucosal edema without fluctuance for fluid collection amenable for drainage in the ED.  No tracheal deviation or lymphadenopathy, no swelling or tenderness palpation or crepitus of the anterior neck.  Abdominal exam is benign.  Will proceed with basic laboratory studies and with CT maxillofacial given pain with EOMs.  First dose of IV antibiotics administered in the emergency department.  CBC with mild leukocytosis of 10.9, BMP with  mild elevated creatinine to 1.1, previously 0.9.  Patient is not pregnant.  CT max face revealed root abscess at the maxillary tooth #10 with anterior cortical breakthrough, resulting left facial inflammatory  change without evidence of soft tissue abscess.  There is preseptal periorbital edema on the left without evidence of post septal involvement.  Patient's presentation and work-up most consistent with periapical infection with resultant left-sided facial edema.  Will discharge with prescription for clindamycin and close outpatient follow-up.  No further work-up warranted in the ED this time given reassuring vital signs and laboratory studies.  Christine Simpson voiced understanding of her medical evaluation and treatment plan.  Each of her questions was answered to her expressed satisfaction.  Strict return precautions were given.  Patient is stable and appropriate for discharge at this time.  This chart was dictated using voice recognition software, Dragon. Despite the best efforts of this provider to proofread and correct errors, errors may still occur which can change documentation meaning.  Final Clinical Impression(s) / ED Diagnoses Final diagnoses:  None    Rx / DC Orders ED Discharge Orders    None       Sherrilee Gilles 11/06/20 2028    Virgina Norfolk, DO 11/06/20 2316

## 2020-11-07 ENCOUNTER — Emergency Department (HOSPITAL_BASED_OUTPATIENT_CLINIC_OR_DEPARTMENT_OTHER)
Admission: EM | Admit: 2020-11-07 | Discharge: 2020-11-07 | Disposition: A | Discharge status: Home / Self Care | Payer: Self-pay | Attending: Emergency Medicine | Admitting: Emergency Medicine

## 2020-11-07 ENCOUNTER — Encounter (HOSPITAL_BASED_OUTPATIENT_CLINIC_OR_DEPARTMENT_OTHER): Payer: Self-pay | Admitting: *Deleted

## 2020-11-07 DIAGNOSIS — R1310 Dysphagia, unspecified: Secondary | ICD-10-CM | POA: Insufficient documentation

## 2020-11-07 DIAGNOSIS — L299 Pruritus, unspecified: Secondary | ICD-10-CM | POA: Insufficient documentation

## 2020-11-07 DIAGNOSIS — R202 Paresthesia of skin: Secondary | ICD-10-CM | POA: Insufficient documentation

## 2020-11-07 DIAGNOSIS — X58XXXA Exposure to other specified factors, initial encounter: Secondary | ICD-10-CM | POA: Insufficient documentation

## 2020-11-07 DIAGNOSIS — T360X5A Adverse effect of penicillins, initial encounter: Secondary | ICD-10-CM | POA: Insufficient documentation

## 2020-11-07 DIAGNOSIS — T7840XA Allergy, unspecified, initial encounter: Secondary | ICD-10-CM

## 2020-11-07 DIAGNOSIS — R220 Localized swelling, mass and lump, head: Secondary | ICD-10-CM | POA: Insufficient documentation

## 2020-11-07 DIAGNOSIS — K047 Periapical abscess without sinus: Secondary | ICD-10-CM | POA: Insufficient documentation

## 2020-11-07 DIAGNOSIS — Z87891 Personal history of nicotine dependence: Secondary | ICD-10-CM | POA: Insufficient documentation

## 2020-11-07 LAB — CBC WITH DIFFERENTIAL/PLATELET
Abs Immature Granulocytes: 0.03 10*3/uL (ref 0.00–0.07)
Basophils Absolute: 0.1 10*3/uL (ref 0.0–0.1)
Basophils Relative: 1 %
Eosinophils Absolute: 0.3 10*3/uL (ref 0.0–0.5)
Eosinophils Relative: 4 %
HCT: 44.2 % (ref 36.0–46.0)
Hemoglobin: 13.8 g/dL (ref 12.0–15.0)
Immature Granulocytes: 0 %
Lymphocytes Relative: 15 %
Lymphs Abs: 1.1 10*3/uL (ref 0.7–4.0)
MCH: 26.9 pg (ref 26.0–34.0)
MCHC: 31.2 g/dL (ref 30.0–36.0)
MCV: 86.2 fL (ref 80.0–100.0)
Monocytes Absolute: 0.8 10*3/uL (ref 0.1–1.0)
Monocytes Relative: 11 %
Neutro Abs: 5.2 10*3/uL (ref 1.7–7.7)
Neutrophils Relative %: 69 %
Platelets: 418 10*3/uL — ABNORMAL HIGH (ref 150–400)
RBC: 5.13 MIL/uL — ABNORMAL HIGH (ref 3.87–5.11)
RDW: 13.3 % (ref 11.5–15.5)
WBC: 7.4 10*3/uL (ref 4.0–10.5)
nRBC: 0 % (ref 0.0–0.2)

## 2020-11-07 LAB — BASIC METABOLIC PANEL
Anion gap: 8 (ref 5–15)
BUN: 11 mg/dL (ref 6–20)
CO2: 27 mmol/L (ref 22–32)
Calcium: 8.2 mg/dL — ABNORMAL LOW (ref 8.9–10.3)
Chloride: 100 mmol/L (ref 98–111)
Creatinine, Ser: 0.94 mg/dL (ref 0.44–1.00)
GFR, Estimated: >60 mL/min (ref >60)
Glucose, Bld: 160 mg/dL — ABNORMAL HIGH (ref 70–99)
Potassium: 3.7 mmol/L (ref 3.5–5.1)
Sodium: 135 mmol/L (ref 135–145)

## 2020-11-07 MED ORDER — AMOXICILLIN-POT CLAVULANATE 875-125 MG PO TABS: 1.0000 | ORAL_TABLET | Freq: Two times a day (BID) | ORAL | 0 refills | Status: AC

## 2020-11-07 MED ORDER — METRONIDAZOLE 500 MG/100ML IV SOLN
500.0000 mg | Freq: Once | INTRAVENOUS | Status: AC
  Administered 2020-11-07: 500 mg via INTRAVENOUS
  Filled 2020-11-07: qty 100

## 2020-11-07 MED ORDER — DIPHENHYDRAMINE HCL 25 MG PO CAPS
50.0000 mg | ORAL_CAPSULE | Freq: Once | ORAL | Status: AC
  Administered 2020-11-07: 50 mg via ORAL
  Filled 2020-11-07: qty 2

## 2020-11-07 MED ORDER — METHYLPREDNISOLONE SODIUM SUCC 125 MG IJ SOLR
125.0000 mg | Freq: Once | INTRAMUSCULAR | Status: AC
  Administered 2020-11-07: 125 mg via INTRAVENOUS
  Filled 2020-11-07: qty 2

## 2020-11-07 MED ORDER — FAMOTIDINE 20 MG PO TABS
20.0000 mg | ORAL_TABLET | Freq: Once | ORAL | Status: AC
Start: 2020-11-07 — End: 2020-11-07
  Administered 2020-11-07: 20 mg via ORAL
  Filled 2020-11-07: qty 1

## 2020-11-07 MED ORDER — ACETAMINOPHEN 500 MG PO TABS
1000.0000 mg | ORAL_TABLET | Freq: Once | ORAL | Status: AC
  Administered 2020-11-07: 1000 mg via ORAL
  Filled 2020-11-07: qty 2

## 2020-11-07 MED ORDER — SODIUM CHLORIDE 0.9 % IV SOLN
INTRAVENOUS | Status: DC | PRN
  Administered 2020-11-07: 250 mL via INTRAVENOUS

## 2020-11-07 MED ORDER — SODIUM CHLORIDE 0.9 % IV SOLN
2.0000 g | Freq: Once | INTRAVENOUS | Status: AC
  Administered 2020-11-07: 2 g via INTRAVENOUS
  Filled 2020-11-07: qty 20

## 2020-11-07 NOTE — Discharge Instructions (Signed)
Your symptom is likely due to an allergic reaction to medication likely clindamycin.  Please discontinue this medication and instead take Augmentin as treatment for your dental infection.  You may take Benadryl at home as needed for allergic reaction.  Return if your symptoms worsen or if you have any other concern.

## 2020-11-07 NOTE — ED Provider Notes (Signed)
[facility] EMERGENCY DEPARTMENT Provider Note   CSN: [id number] Arrival date & time: 11/07/20  1706     History Chief Complaint  Patient presents with  . Oral Swelling    Christine Simpson is a 40 y.o. female.  The history is provided by the patient and medical records. No language interpreter was used.     40 year old female recently diagnosed with dental infection presenting for evaluation of worsening dental pain and facial swelling.  For the past 6 days patient has had an persistent pain and swelling about her Left upper cheek and face.  She was initially evaluated by a dentist and was prescribed amoxicillin however symptoms progressed and she was seen in the ED yesterday for her symptoms.  A CT scan of maxillofacial was obtained demonstrates root abscess at the maxillary tooth #10 with anterior cortical breakthrough resulting in left facial inflammatory changes without soft tissue abscess.  Patient was given clindamycin via IV and was discharged home with clindamycin and ibuprofen.  She mention this morning she experiencing tongue swelling, difficulty swallowing, as well as tingling sensation in to her toes and fingers along with itchiness.  She however felt that the infection seems to be improving with antibiotic.  She still endorsed fever.  No nausea vomiting diarrhea chest pain shortness of breath or wheezing or abdominal pain.  Past Medical History:  Diagnosis Date  . Corneal abrasion     There are no problems to display for this patient.   History reviewed. No pertinent surgical history.   OB History   No obstetric history on file.     No family history on file.  Social History   Tobacco Use  . Smoking status: Former Games developer  . Smokeless tobacco: Never Used  Substance Use Topics  . Alcohol use: Yes    Comment: occ  . Drug use: No    Home Medications Prior to Admission medications   Medication Sig Start Date End Date Taking? Authorizing Provider   acetaminophen (TYLENOL) 325 MG tablet Take 2 tablets (650 mg total) by mouth every 6 (six) hours as needed for mild pain or moderate pain. 10/11/17   Georgiana Shore, PA-C  clindamycin (CLEOCIN) 300 MG capsule Take 1 capsule (300 mg total) by mouth 4 (four) times daily for 10 days. 11/06/20 11/16/20  Sponseller, Eugene Gavia, PA-C  diphenhydrAMINE (BENADRYL) 25 MG tablet Take 1 tablet (25 mg total) by mouth every 6 (six) hours. 10/11/17   Georgiana Shore, PA-C  ibuprofen (ADVIL,MOTRIN) 600 MG tablet Take 1 tablet (600 mg total) by mouth every 6 (six) hours as needed. 10/10/17   Georgiana Shore, PA-C  ketotifen (ZADITOR) 0.025 % ophthalmic solution Place 1 drop into both eyes 2 (two) times daily. 10/21/20   Sabas Sous, MD  naphazoline-pheniramine (NAPHCON-A) 0.025-0.3 % ophthalmic solution Place 1 drop into both eyes every 4 (four) hours as needed for irritation. 08/10/16   Mackuen, Courteney Lyn, MD  phenol (CHLORASEPTIC) 1.4 % LIQD Use as directed 1 spray in the mouth or throat as needed for throat irritation / pain. Make sure that you do not swallow and spit up after gargling. 10/10/17   Georgiana Shore, PA-C    Allergies    Patient has no known allergies.  Review of Systems   Review of Systems  All other systems reviewed and are negative.   Physical Exam Updated Vital Signs BP (!) 147/99 (BP Location: Left Arm)   Pulse 99   Temp (!) 100.8  F (38.2 C) (Oral)   Resp 20   Ht 5\' 5"  (1.651 m)   Wt (!) 138.3 kg   LMP 10/19/2020   SpO2 97%   BMI 50.74 kg/m   Physical Exam Vitals and nursing note reviewed.  Constitutional:      General: She is not in acute distress.    Appearance: She is well-developed. She is obese.  HENT:     Head: Atraumatic.     Mouth/Throat:     Comments: Dental decay noted to tooth #10 with adjacent gingival edema.  Mild edema noted to the base of the tongue.  Uvula midline and nonedematous.  Mild trismus. Eyes:     Conjunctiva/sclera: Conjunctivae  normal.  Cardiovascular:     Rate and Rhythm: Normal rate and regular rhythm.     Pulses: Normal pulses.     Heart sounds: Normal heart sounds.  Pulmonary:     Breath sounds: Normal breath sounds. No wheezing.  Abdominal:     Palpations: Abdomen is soft.     Tenderness: There is no abdominal tenderness.  Musculoskeletal:     Cervical back: Neck supple.  Skin:    Findings: No rash.  Neurological:     Mental Status: She is alert and oriented to person, place, and time.  Psychiatric:        Mood and Affect: Mood normal.     ED Results / Procedures / Treatments   Labs (all labs ordered are listed, but only abnormal results are displayed) Labs Reviewed - No data to display  EKG None  Radiology CT Maxillofacial Wo Contrast  Result Date: 11/06/2020 CLINICAL DATA:  Dental infection.  Facial swelling on the left. EXAM: CT MAXILLOFACIAL WITHOUT CONTRAST TECHNIQUE: Multidetector CT imaging of the maxillofacial structures was performed. Multiplanar CT image reconstructions were also generated. COMPARISON:  10/11/2017 FINDINGS: Osseous: No significant mandibular dental disease. There may be some scattered caries. Maxillary dentition shows a root abscess at maxillary tooth 10 with anterior cortical breakthrough, likely the source of the facial inflammation. Orbits: Pre septal orbital soft tissue swelling on the left. No postseptal orbital inflammation. Sinuses: Mild mucosal thickening of the left maxillary sinus. Other sinuses are clear. Soft tissues: Mild soft tissue edema pattern of the left face and left cheek. Moderate periorbital soft tissue edema on the left. Limited intracranial: Normal IMPRESSION: Root abscess at maxillary tooth 10 with anterior cortical breakthrough. Subsequent left facial inflammatory change without evidence of a soft tissue abscess. Preseptal periorbital edema on the left without evidence of postseptal involvement. Electronically Signed   By: 12/11/2017 M.D.   On:  11/06/2020 20:08    Procedures Procedures   Medications Ordered in ED Medications  0.9 %  sodium chloride infusion (250 mLs Intravenous New Bag/Given 11/07/20 1833)  cefTRIAXone (ROCEPHIN) 2 g in sodium chloride 0.9 % 100 mL IVPB (0 g Intravenous Stopped 11/07/20 1910)  methylPREDNISolone sodium succinate (SOLU-MEDROL) 125 mg/2 mL injection 125 mg (125 mg Intravenous Given 11/07/20 1833)  diphenhydrAMINE (BENADRYL) capsule 50 mg (50 mg Oral Given 11/07/20 1822)  famotidine (PEPCID) tablet 20 mg (20 mg Oral Given 11/07/20 1823)  acetaminophen (TYLENOL) tablet 1,000 mg (1,000 mg Oral Given 11/07/20 1823)  metroNIDAZOLE (FLAGYL) IVPB 500 mg (0 mg Intravenous Stopped 11/07/20 2011)    ED Course  I have reviewed the triage vital signs and the nursing notes.  Pertinent labs & imaging results that were available during my care of the patient were reviewed by me and considered in my  medical decision making (see chart for details).    MDM Rules/Calculators/A&P                          BP 128/79   Pulse 80   Temp (!) 100.8 F (38.2 C) (Oral)   Resp 20   Ht 5\' 5"  (1.651 m)   Wt (!) 138.3 kg   LMP 10/19/2020   SpO2 97%   BMI 50.74 kg/m   Final Clinical Impression(s) / ED Diagnoses Final diagnoses:  Allergic reaction to drug, initial encounter  Dental abscess    Rx / DC Orders ED Discharge Orders         Ordered    amoxicillin-clavulanate (AUGMENTIN) 875-125 MG tablet  2 times daily        11/07/20 2205         6:06 PM Patient was diagnosed with odontogenic infection involving tooth #10 through a CT scan that was performed yesterday.  Was discharged home with clindamycin after history received IV clindamycin.  She is here with complaints of tongue swelling, itching and burning sensation to the tips of her fingers and toes without hives.  I suspect this is likely a drug allergic reaction and not likely to be worsening of her odontogenic infection.  She has been taking ibuprofen new  medications clindamycin.  She still have a fever of 100.8.  Plan to provide medication for her allergic reaction as well as switch her to a different type of antibiotic to cover for her dental infection  10:10 PM Patient reports that improvement of her symptoms.  Her is now with normal appearance.  She did receive antibiotic here.  I encourage patient to discontinue clindamycin and switch over to Augmentin.  Outpatient follow-up recommended.  Return precaution given.  Benadryl as needed at home   [id number], Fayrene Helper 11/07/20 2210    01/07/21, MD 11/08/20 1501

## 2020-11-07 NOTE — ED Triage Notes (Signed)
Seen here yesterday for dental pain and facial swelling. Had CT and labs done and was d/c home on clindamycin. States now her tongue is involved and she is having trouble swallowing

## 2021-03-22 ENCOUNTER — Emergency Department (HOSPITAL_BASED_OUTPATIENT_CLINIC_OR_DEPARTMENT_OTHER): Payer: Self-pay

## 2021-03-22 ENCOUNTER — Other Ambulatory Visit: Payer: Self-pay

## 2021-03-22 ENCOUNTER — Emergency Department (HOSPITAL_BASED_OUTPATIENT_CLINIC_OR_DEPARTMENT_OTHER)
Admission: EM | Admit: 2021-03-22 | Discharge: 2021-03-22 | Disposition: A | Discharge status: Home / Self Care | Payer: Self-pay | Attending: Emergency Medicine | Admitting: Emergency Medicine

## 2021-03-22 ENCOUNTER — Encounter (HOSPITAL_BASED_OUTPATIENT_CLINIC_OR_DEPARTMENT_OTHER): Payer: Self-pay | Admitting: Urology

## 2021-03-22 DIAGNOSIS — M79672 Pain in left foot: Secondary | ICD-10-CM | POA: Insufficient documentation

## 2021-03-22 DIAGNOSIS — S90112A Contusion of left great toe without damage to nail, initial encounter: Secondary | ICD-10-CM | POA: Insufficient documentation

## 2021-03-22 DIAGNOSIS — S99922A Unspecified injury of left foot, initial encounter: Secondary | ICD-10-CM

## 2021-03-22 DIAGNOSIS — Z87891 Personal history of nicotine dependence: Secondary | ICD-10-CM | POA: Insufficient documentation

## 2021-03-22 DIAGNOSIS — W208XXA Other cause of strike by thrown, projected or falling object, initial encounter: Secondary | ICD-10-CM | POA: Insufficient documentation

## 2021-03-22 MED ORDER — IBUPROFEN 800 MG PO TABS
800.0000 mg | ORAL_TABLET | Freq: Once | ORAL | Status: AC
  Administered 2021-03-22: 800 mg via ORAL
  Filled 2021-03-22: qty 1

## 2021-03-22 MED ORDER — IBUPROFEN 800 MG PO TABS: 800.0000 mg | ORAL_TABLET | Freq: Three times a day (TID) | ORAL | 0 refills | Status: AC

## 2021-03-22 NOTE — ED Triage Notes (Signed)
Dropped can on left foot 2 days ago, states pain to dorsal foot just above great toe. Bruising and swelling noted

## 2021-03-22 NOTE — Discharge Instructions (Addendum)
It was a pleasure taking care of you today. As discussed, your x-ray did not show any broken bones. 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 I am sending you home with ibuprofen. Take as needed for pain. Continue to ice and elevate your foot. Follow-up with PCP if symptoms do not improve over the next week. Return to the ER for new or worsening symptoms.

## 2021-03-22 NOTE — ED Provider Notes (Signed)
[facility] EMERGENCY DEPARTMENT Provider Note   CSN: [id number] Arrival date & time: 03/22/21  1551     History Chief Complaint  Patient presents with   Foot Injury    Christine Simpson is a 40 y.o. female with no significant past medical history who presents to the ED due to persistent left foot pain x2 days.  Patient states she dropped a can of food on her left foot 2 days ago.  Pain is located around the left great toe into the dorsum of the foot.  Pain worse with palpation and ambulation.  Pain associated with edema and mild ecchymosis.  She has not tried anything for her symptoms.  Denies associated numbness/tingling.   History obtained from patient and past medical records. No interpreter used during encounter.       Past Medical History:  Diagnosis Date   Corneal abrasion     There are no problems to display for this patient.   History reviewed. No pertinent surgical history.   OB History   No obstetric history on file.     History reviewed. No pertinent family history.  Social History   Tobacco Use   Smoking status: Former   Smokeless tobacco: Never  Substance Use Topics   Alcohol use: Yes    Comment: occ   Drug use: No    Home Medications Prior to Admission medications   Medication Sig Start Date End Date Taking? Authorizing Provider  ibuprofen (ADVIL) 800 MG tablet Take 1 tablet (800 mg total) by mouth 3 (three) times daily. 03/22/21  Yes Geoffry Bannister, Merla Riches, PA-C  acetaminophen (TYLENOL) 325 MG tablet Take 2 tablets (650 mg total) by mouth every 6 (six) hours as needed for mild pain or moderate pain. 10/11/17   Mathews Robinsons B, PA-C  amoxicillin-clavulanate (AUGMENTIN) 875-125 MG tablet Take 1 tablet by mouth 2 (two) times daily. One po bid x 7 days 11/07/20   Fayrene Helper, PA-C  diphenhydrAMINE (BENADRYL) 25 MG tablet Take 1 tablet (25 mg total) by mouth every 6 (six) hours. 10/11/17   Georgiana Shore, PA-C  ibuprofen (ADVIL,MOTRIN) 600 MG  tablet Take 1 tablet (600 mg total) by mouth every 6 (six) hours as needed. 10/10/17   Georgiana Shore, PA-C  ketotifen (ZADITOR) 0.025 % ophthalmic solution Place 1 drop into both eyes 2 (two) times daily. 10/21/20   Sabas Sous, MD  naphazoline-pheniramine (NAPHCON-A) 0.025-0.3 % ophthalmic solution Place 1 drop into both eyes every 4 (four) hours as needed for irritation. 08/10/16   Mackuen, Courteney Lyn, MD  phenol (CHLORASEPTIC) 1.4 % LIQD Use as directed 1 spray in the mouth or throat as needed for throat irritation / pain. Make sure that you do not swallow and spit up after gargling. 10/10/17   Georgiana Shore, PA-C    Allergies    Clindamycin/lincomycin  Review of Systems   Review of Systems  Musculoskeletal:  Positive for arthralgias and gait problem.  Skin:  Positive for color change.  Neurological:  Negative for numbness.  All other systems reviewed and are negative.  Physical Exam Updated Vital Signs BP (!) 137/98 (BP Location: Right Arm)   Pulse 94   Temp 98.6 F (37 C) (Oral)   Resp 18   Ht 5\' 5"  (1.651 m)   Wt (!) 138.3 kg   SpO2 97%   BMI 50.74 kg/m   Physical Exam Vitals and nursing note reviewed.  Constitutional:      General: She is not  in acute distress.    Appearance: She is not ill-appearing.  HENT:     Head: Normocephalic.  Eyes:     Pupils: Pupils are equal, round, and reactive to light.  Cardiovascular:     Rate and Rhythm: Normal rate and regular rhythm.     Pulses: Normal pulses.     Heart sounds: Normal heart sounds. No murmur heard.   No friction rub. No gallop.  Pulmonary:     Effort: Pulmonary effort is normal.     Breath sounds: Normal breath sounds.  Abdominal:     General: Abdomen is flat. There is no distension.     Palpations: Abdomen is soft.     Tenderness: There is no abdominal tenderness. There is no guarding or rebound.  Musculoskeletal:        General: Normal range of motion.     Cervical back: Neck supple.      Comments: Tenderness palpation throughout left great toe into first metatarsal.  Surrounding edema and mild ecchymosis.  Pedal pulses palpable.  No tenderness throughout left ankle.  Soft compartments.  Skin:    General: Skin is warm and dry.  Neurological:     General: No focal deficit present.     Mental Status: She is alert.  Psychiatric:        Mood and Affect: Mood normal.        Behavior: Behavior normal.    ED Results / Procedures / Treatments   Labs (all labs ordered are listed, but only abnormal results are displayed) Labs Reviewed - No data to display  EKG None  Radiology DG Foot Complete Left  Result Date: 03/22/2021 CLINICAL DATA:  Dropped can near great toe. EXAM: LEFT FOOT - COMPLETE 3+ VIEW COMPARISON:  None. FINDINGS: There is no evidence of fracture or dislocation. There are degenerative changes at the medial cuneiform/navicular joint. Soft tissues are unremarkable. IMPRESSION: 1. No acute fracture or dislocation. 2. There are degenerative changes at the medial cuneiform/navicular joint. Non osseous coalition is also in the differential. Electronically Signed   By: Darliss Cheney M.D.   On: 03/22/2021 16:59    Procedures Procedures   Medications Ordered in ED Medications  ibuprofen (ADVIL) tablet 800 mg (800 mg Oral Given 03/22/21 1711)    ED Course  I have reviewed the triage vital signs and the nursing notes.  Pertinent labs & imaging results that were available during my care of the patient were reviewed by me and considered in my medical decision making (see chart for details).    MDM Rules/Calculators/A&P                           40 year old female presents to the ED due to left foot pain after dropping a can on her foot 2 days ago.  Stable vitals.  Patient in no acute distress.  Tenderness throughout dorsum of left great toe with surrounding edema and ecchymosis.  Pedal pulses palpable.  Soft compartments.  Low suspicion for compartment syndrome.  X-ray personally reviewed which is negative for any bony fractures.  Patient placed in Ace wrap and given crutches for comfort.  Patient discharged with ibuprofen.  RICE discussed with patient.  Advised patient to follow-up with PCP if symptoms not improve over the next week. Strict ED precautions discussed with patient. Patient states understanding and agrees to plan. Patient discharged home in no acute distress and stable vitals  Final Clinical Impression(s) / ED Diagnoses  Final diagnoses:  Injury of left foot, initial encounter    Rx / DC Orders ED Discharge Orders          Ordered    ibuprofen (ADVIL) 800 MG tablet  3 times daily        03/22/21 1735             Mannie Stabile, PA-C 03/22/21 1736    Alvira Monday, MD 03/24/21 2222

## 2021-03-22 NOTE — ED Notes (Signed)
Patient transported to X-ray 

## 2021-03-26 ENCOUNTER — Emergency Department (HOSPITAL_BASED_OUTPATIENT_CLINIC_OR_DEPARTMENT_OTHER)
Admission: EM | Admit: 2021-03-26 | Discharge: 2021-03-26 | Disposition: A | Discharge status: Home / Self Care | Payer: Self-pay | Attending: Emergency Medicine | Admitting: Emergency Medicine

## 2021-03-26 ENCOUNTER — Encounter (HOSPITAL_BASED_OUTPATIENT_CLINIC_OR_DEPARTMENT_OTHER): Payer: Self-pay

## 2021-03-26 ENCOUNTER — Other Ambulatory Visit: Payer: Self-pay

## 2021-03-26 DIAGNOSIS — F17290 Nicotine dependence, other tobacco product, uncomplicated: Secondary | ICD-10-CM | POA: Insufficient documentation

## 2021-03-26 DIAGNOSIS — Z20822 Contact with and (suspected) exposure to covid-19: Secondary | ICD-10-CM | POA: Insufficient documentation

## 2021-03-26 DIAGNOSIS — R519 Headache, unspecified: Secondary | ICD-10-CM | POA: Insufficient documentation

## 2021-03-26 LAB — RESP PANEL BY RT-PCR (FLU A&B, COVID) ARPGX2
Influenza A by PCR: NEGATIVE
Influenza B by PCR: NEGATIVE
SARS Coronavirus 2 by RT PCR: NEGATIVE

## 2021-03-26 MED ORDER — ACETAMINOPHEN 500 MG PO TABS
1000.0000 mg | ORAL_TABLET | Freq: Once | ORAL | Status: AC
  Administered 2021-03-26: 1000 mg via ORAL
  Filled 2021-03-26: qty 2

## 2021-03-26 MED ORDER — METOCLOPRAMIDE HCL 10 MG PO TABS
10.0000 mg | ORAL_TABLET | Freq: Once | ORAL | Status: AC
  Administered 2021-03-26: 10 mg via ORAL
  Filled 2021-03-26: qty 1

## 2021-03-26 MED ORDER — METOCLOPRAMIDE HCL 10 MG PO TABS: 10.0000 mg | ORAL_TABLET | Freq: Four times a day (QID) | ORAL | 0 refills | Status: AC

## 2021-03-26 MED ORDER — DIPHENHYDRAMINE HCL 25 MG PO CAPS
25.0000 mg | ORAL_CAPSULE | Freq: Once | ORAL | Status: AC
  Administered 2021-03-26: 25 mg via ORAL
  Filled 2021-03-26: qty 1

## 2021-03-26 MED ORDER — DIPHENHYDRAMINE HCL 25 MG PO TABS: 25.0000 mg | ORAL_TABLET | Freq: Four times a day (QID) | ORAL | 0 refills | Status: AC | PRN

## 2021-03-26 NOTE — ED Triage Notes (Signed)
Pt c/o HA x 2-3 weeks-denies fever/flu sx-states BR exhaust fan fell on her head prior to started of HA-NAD-steady gait

## 2021-03-26 NOTE — Discharge Instructions (Addendum)
Your COVID and influenza test were negative.  Please follow-up with your primary care provider if you do not have a primary care provider please call to make an appointment of 1.  If you do not have insurance and do not have a primary care provider please call the [facility] to make appointment with them. I have provided you with information You may use Reglan for headache as needed.  Drink plenty of water

## 2021-03-26 NOTE — ED Provider Notes (Signed)
[facility] EMERGENCY DEPARTMENT Provider Note   CSN: [id number] Arrival date & time: 03/26/21  2010     History Chief Complaint  Patient presents with   Headache    Christine Simpson is a 40 y.o. female.  HPI Intermittent frontal HA for 2-3 weeks.  States hx of has w not eating but she's been eating well.   She states that she has had some intermittent headaches over the past 2 or 3 weeks.  Denies any fevers chills cough congestion lightheadedness or dizziness but would like to be tested for COVID-19 today.  Denies any slurred speech confusion  She states that she did have a faint and fall onto her head 3 weeks ago did not knock her unconscious did not have any nausea or vomiting or confusion after this states that she was ambulatory after this.  Denies any bleeding or abrasions.  Denies any neck pain denies any numbness or weakness in the extremity.  No other new significant symptoms.  Does not have any aggravating mitigate factors apart from occasionally she feels that bright lights can cause her headaches to get worse.     Past Medical History:  Diagnosis Date   Corneal abrasion     There are no problems to display for this patient.   History reviewed. No pertinent surgical history.   OB History   No obstetric history on file.     No family history on file.  Social History   Tobacco Use   Smoking status: Every Day    Types: Cigars   Smokeless tobacco: Never  Vaping Use   Vaping Use: Never used  Substance Use Topics   Alcohol use: Yes    Comment: occ   Drug use: No    Home Medications Prior to Admission medications   Medication Sig Start Date End Date Taking? Authorizing Provider  diphenhydrAMINE (BENADRYL) 25 MG tablet Take 1 tablet (25 mg total) by mouth every 6 (six) hours as needed. 03/26/21  Yes Sinjin Amero S, PA  metoCLOPramide (REGLAN) 10 MG tablet Take 1 tablet (10 mg total) by mouth every 6 (six) hours. 03/26/21  Yes Maria Coin,  Stevphen Meuse S, PA  acetaminophen (TYLENOL) 325 MG tablet Take 2 tablets (650 mg total) by mouth every 6 (six) hours as needed for mild pain or moderate pain. 10/11/17   Mathews Robinsons B, PA-C  amoxicillin-clavulanate (AUGMENTIN) 875-125 MG tablet Take 1 tablet by mouth 2 (two) times daily. One po bid x 7 days 11/07/20   Fayrene Helper, PA-C  ibuprofen (ADVIL) 800 MG tablet Take 1 tablet (800 mg total) by mouth 3 (three) times daily. 03/22/21   Mannie Stabile, PA-C  ibuprofen (ADVIL,MOTRIN) 600 MG tablet Take 1 tablet (600 mg total) by mouth every 6 (six) hours as needed. 10/10/17   Georgiana Shore, PA-C  ketotifen (ZADITOR) 0.025 % ophthalmic solution Place 1 drop into both eyes 2 (two) times daily. 10/21/20   Sabas Sous, MD  naphazoline-pheniramine (NAPHCON-A) 0.025-0.3 % ophthalmic solution Place 1 drop into both eyes every 4 (four) hours as needed for irritation. 08/10/16   Mackuen, Courteney Lyn, MD  phenol (CHLORASEPTIC) 1.4 % LIQD Use as directed 1 spray in the mouth or throat as needed for throat irritation / pain. Make sure that you do not swallow and spit up after gargling. 10/10/17   Georgiana Shore, PA-C    Allergies    Clindamycin/lincomycin  Review of Systems   Review of Systems  Constitutional:  Negative  for chills and fever.  HENT:  Negative for congestion.   Eyes:  Negative for pain.  Respiratory:  Negative for cough and shortness of breath.   Cardiovascular:  Negative for chest pain and leg swelling.  Gastrointestinal:  Negative for abdominal pain and vomiting.  Genitourinary:  Negative for dysuria.  Musculoskeletal:  Negative for myalgias.  Skin:  Negative for rash.  Neurological:  Positive for headaches. Negative for dizziness.   Physical Exam Updated Vital Signs BP 127/85 (BP Location: Right Arm)   Pulse 81   Temp 98.3 F (36.8 C) (Oral)   Resp 16   Ht 5\' 5"  (1.651 m)   Wt (!) 140.6 kg   SpO2 98%   BMI 51.59 kg/m   Physical Exam Vitals and nursing note  reviewed.  Constitutional:      General: She is not in acute distress.    Comments: Pleasant well-appearing 40 year old.  In no acute distress.  Sitting comfortably in bed.  Able answer questions appropriately follow commands. No increased work of breathing. Speaking in full sentences.   HENT:     Head: Normocephalic and atraumatic.     Nose: Nose normal.  Eyes:     General: No scleral icterus. Cardiovascular:     Rate and Rhythm: Normal rate and regular rhythm.     Pulses: Normal pulses.     Heart sounds: Normal heart sounds.  Pulmonary:     Effort: Pulmonary effort is normal. No respiratory distress.     Breath sounds: No wheezing.  Abdominal:     Palpations: Abdomen is soft.     Tenderness: There is no abdominal tenderness.  Musculoskeletal:     Cervical back: Normal range of motion.     Right lower leg: No edema.     Left lower leg: No edema.  Skin:    General: Skin is warm and dry.     Capillary Refill: Capillary refill takes less than 2 seconds.  Neurological:     Mental Status: She is alert. Mental status is at baseline.     Comments: Alert and oriented to self, place, time and event.   Speech is fluent, clear without dysarthria or dysphasia.   Strength 5/5 in upper/lower extremities   Sensation intact in upper/lower extremities   Normal gait.  CN I not tested  CN II grossly intact visual fields bilaterally. Did not visualize posterior eye.  CN III, IV, VI PERRLA and EOMs intact bilaterally  CN V Intact sensation to sharp and light touch to the face  CN VII facial movements symmetric  CN VIII not tested  CN IX, X no uvula deviation, symmetric rise of soft palate  CN XI 5/5 SCM and trapezius strength bilaterally  CN XII Midline tongue protrusion, symmetric L/R movements   Psychiatric:        Mood and Affect: Mood normal.        Behavior: Behavior normal.    ED Results / Procedures / Treatments   Labs (all labs ordered are listed, but only abnormal results are  displayed) Labs Reviewed  RESP PANEL BY RT-PCR (FLU A&B, COVID) ARPGX2    EKG None  Radiology No results found.  Procedures Procedures   Medications Ordered in ED Medications  acetaminophen (TYLENOL) tablet 1,000 mg (1,000 mg Oral Given 03/26/21 2143)  metoCLOPramide (REGLAN) tablet 10 mg (10 mg Oral Given 03/26/21 2143)  diphenhydrAMINE (BENADRYL) capsule 25 mg (25 mg Oral Given 03/26/21 2143)    ED Course  I have reviewed  the triage vital signs and the nursing notes.  Pertinent labs & imaging results that were available during my care of the patient were reviewed by me and considered in my medical decision making (see chart for details).    MDM Rules/Calculators/A&P                          Patient is a 40 year old female presented today with frontal headache she states it is achy and constant but seems that it is intact intermittent over the past 3 weeks.  Did have a minor trauma to her head 3 weeks ago states that she has headaches before this but she feels a more frequent she has been taking Tylenol with minimal relief  No fevers no numbness or weakness  Neurologically intact on exam.   Faustine Tates was evaluated in Emergency Department on 03/26/2021 for the symptoms described in the history of present illness. She was evaluated in the context of the global COVID-19 pandemic, which necessitated consideration that the patient might be at risk for infection with the SARS-CoV-2 virus that causes COVID-19. Institutional protocols and algorithms that pertain to the evaluation of patients at risk for COVID-19 are in a state of rapid change based on information released by regulatory bodies including the CDC and federal and state organizations. These policies and algorithms were followed during the patient's care in the ED.   COVID influenza test negative.  Patient states her headache is improved after Reglan Benadryl Tylenol here in the ER.  Will discharge home with Reglan  and Benadryl.  Recommend follow-up with neurology/PCP  Return precautions given.  Final Clinical Impression(s) / ED Diagnoses Final diagnoses:  Nonintractable episodic headache, unspecified headache type    Rx / DC Orders ED Discharge Orders          Ordered    metoCLOPramide (REGLAN) 10 MG tablet  Every 6 hours        03/26/21 2245    diphenhydrAMINE (BENADRYL) 25 MG tablet  Every 6 hours PRN        03/26/21 2245             Gailen Shelter, [place] 03/26/21 2340    Terrilee Files, MD 03/27/21 1109

## 2022-06-01 IMAGING — CT CT MAXILLOFACIAL W/O CM
3 series · 15 of 47 positions shown, 18 images · non-contrast
Comparison: 10/11/2017

CLINICAL DATA: Dental infection.  Facial swelling on the left.

EXAM:
CT MAXILLOFACIAL WITHOUT CONTRAST
TECHNIQUE: Multidetector CT imaging of the maxillofacial structures was
performed. Multiplanar CT image reconstructions were also generated.

[Series 2: max soft · axial · 0.37mm/px · z∈[+746,+902]mm · 9 of 92 slices shown, 12 images]
[im 7/92  brain]
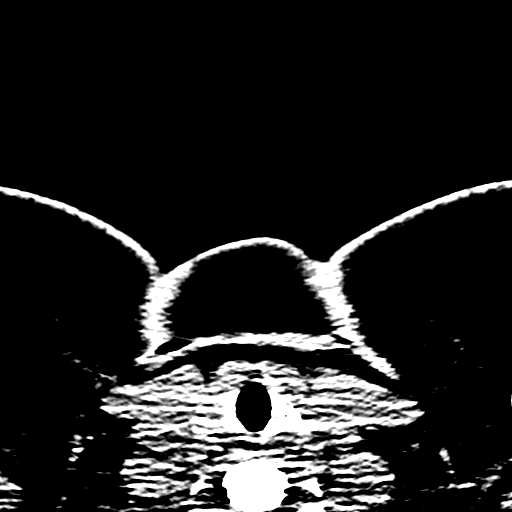
[im 7/92  bone]
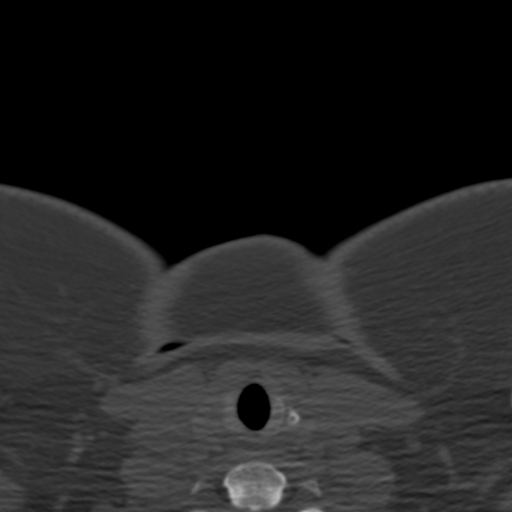
[im 16/92  bone]
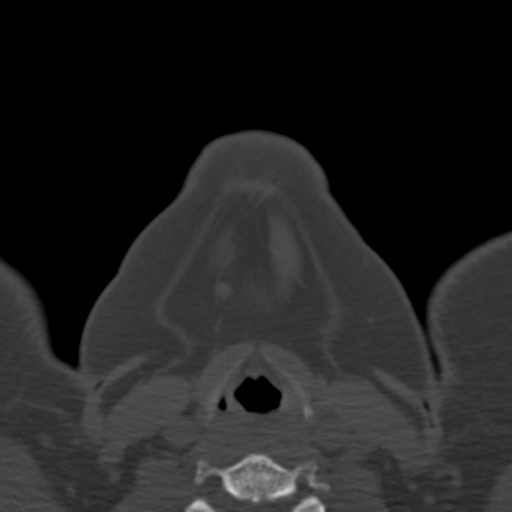
[im 26/92  bone]
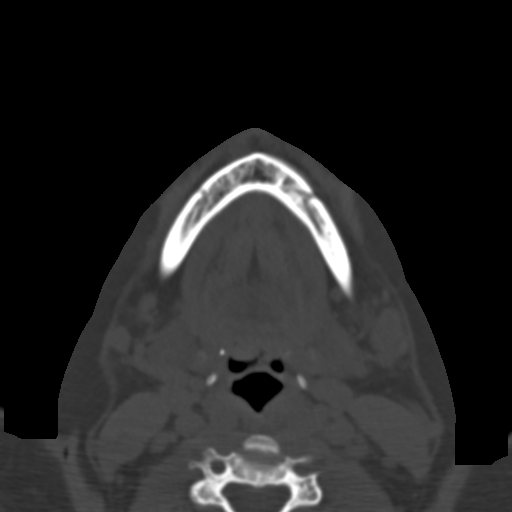
[im 35/92  bone]
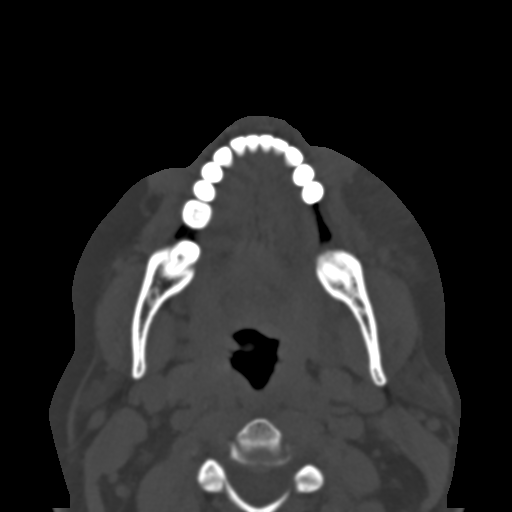
[im 48/92  brain]
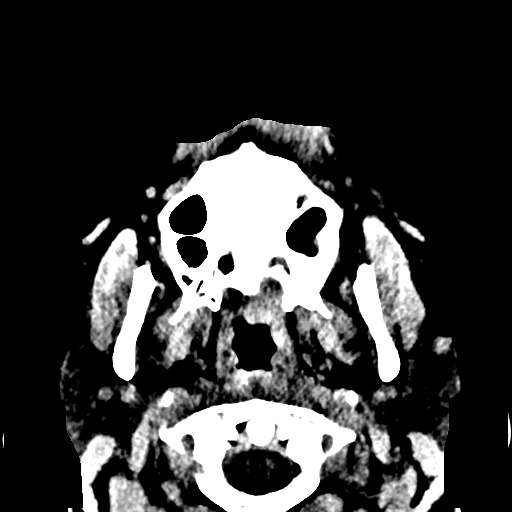
[im 48/92  bone]
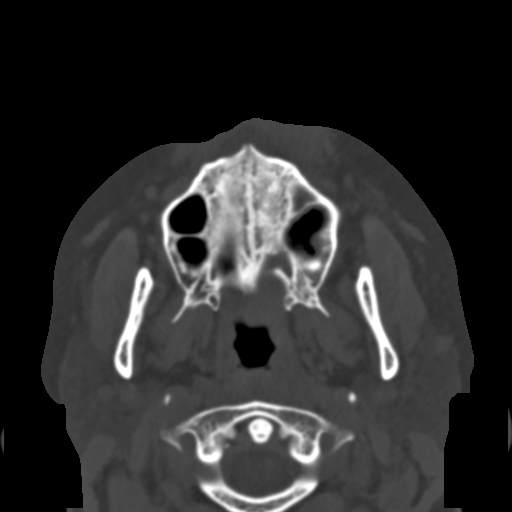
[im 57/92  bone]
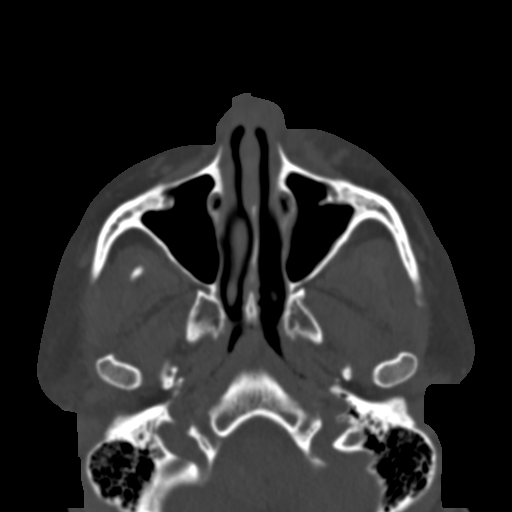
[im 66/92  bone]
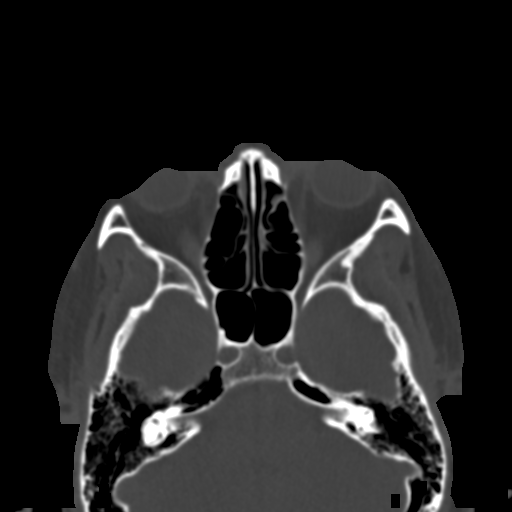
[im 76/92  bone]
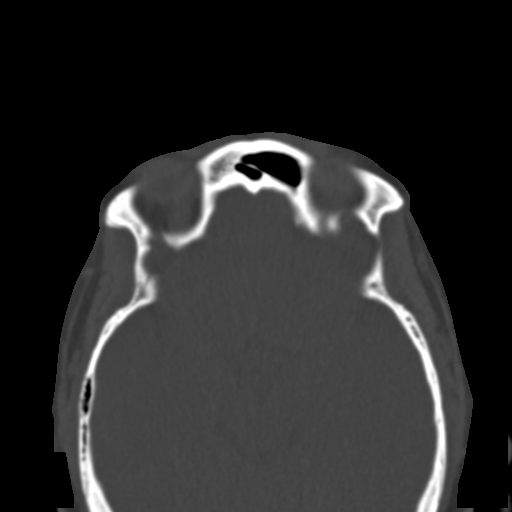
[im 85/92  brain]
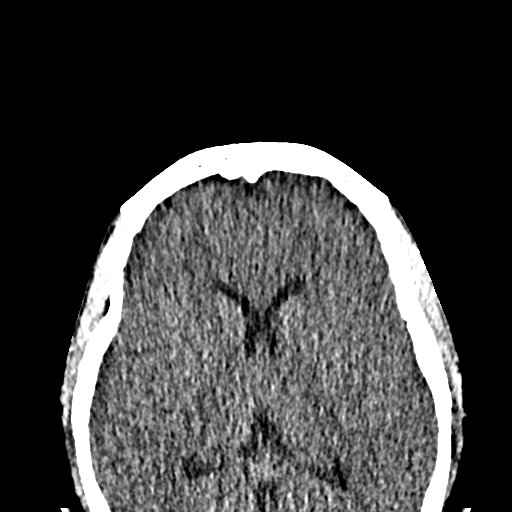
[im 85/92  bone]
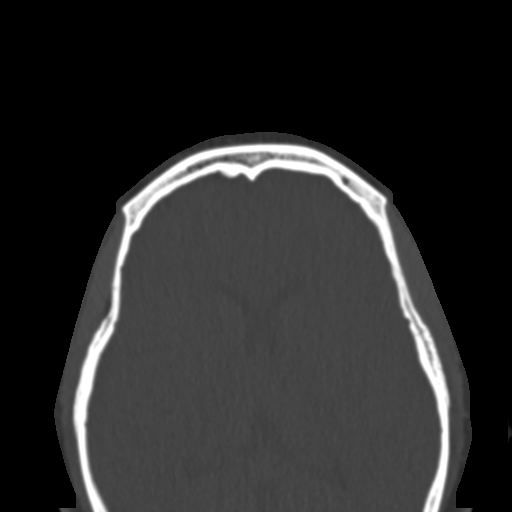

[Series 6: coronal soft · coronal · 0.36mm/px · 3 of 91 slices shown]
[im 31/91  bone]
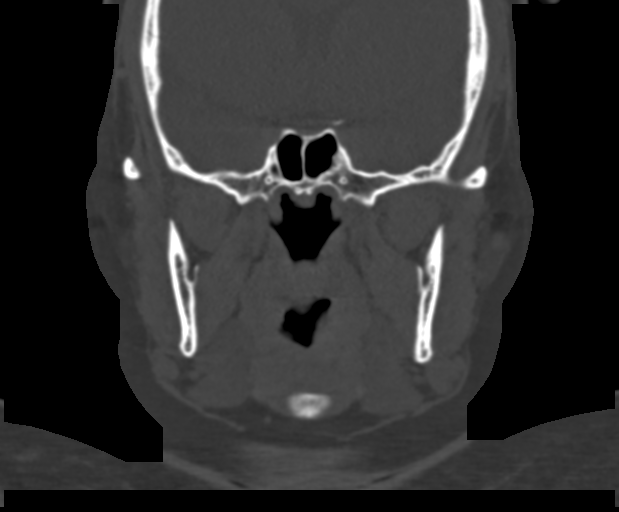
[im 41/91  bone]
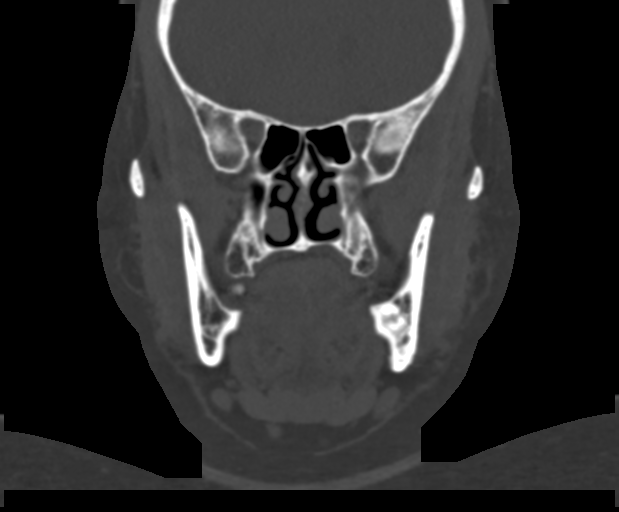
[im 51/91  bone]
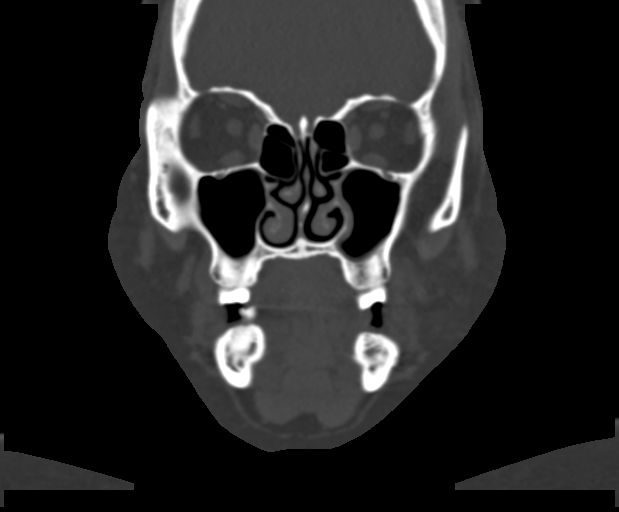

[Series 7: sagittal soft · sagittal · 0.36mm/px · 3 of 94 slices shown]
[im 32/94  bone]
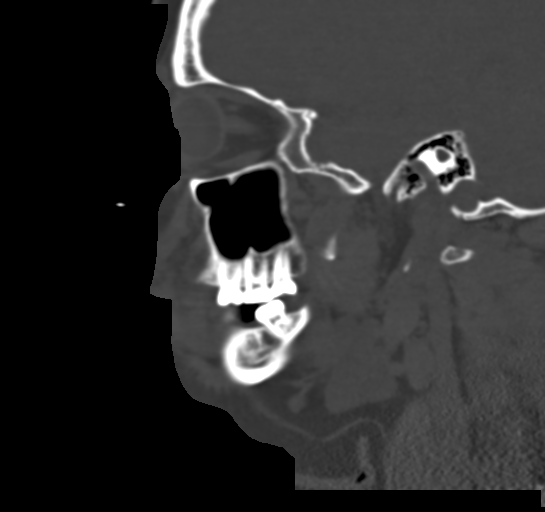
[im 47/94  bone]
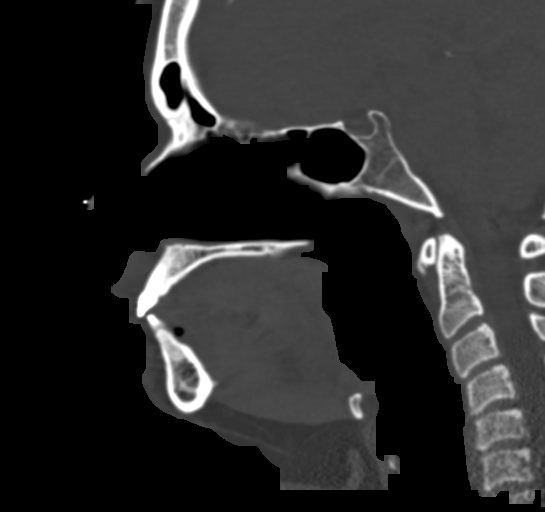
[im 63/94  bone]
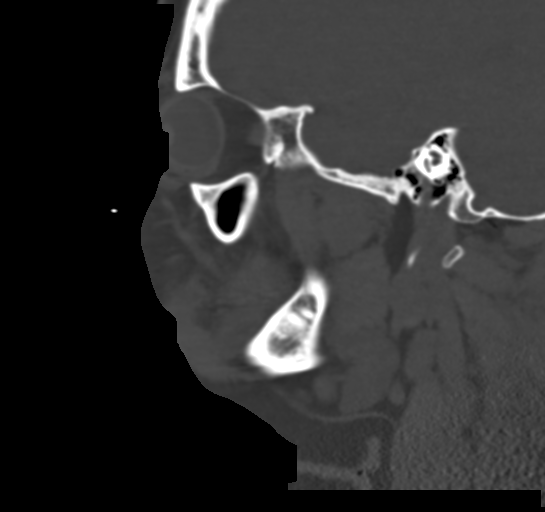

[15 of 47 positions shown; findings below may reference images not displayed]

FINDINGS: Osseous: No significant mandibular dental disease. There may be some
scattered caries. Maxillary dentition shows a root abscess at
maxillary tooth 10 with anterior cortical breakthrough, likely the
source of the facial inflammation.

Orbits: Pre septal orbital soft tissue swelling on the left. No
postseptal orbital inflammation.

Sinuses: Mild mucosal thickening of the left maxillary sinus. Other
sinuses are clear.

Soft tissues: Mild soft tissue edema pattern of the left face and
left cheek. Moderate periorbital soft tissue edema on the left.

Limited intracranial: Normal
IMPRESSION: Root abscess at maxillary tooth 10 with anterior cortical
breakthrough. Subsequent left facial inflammatory change without
evidence of a soft tissue abscess. Preseptal periorbital edema on
the left without evidence of postseptal involvement.

## 2023-01-12 ENCOUNTER — Encounter: Payer: Medicaid Other | Admitting: Obstetrics and Gynecology
